# Patient Record
Sex: Male | Born: 1975
Health system: Southern US, Community
[De-identification: ages and names within clinical notes are randomized; demographics above are authoritative.]

## PROBLEM LIST (undated history)

## (undated) DIAGNOSIS — U071 COVID-19: Secondary | ICD-10-CM

## (undated) DIAGNOSIS — J301 Allergic rhinitis due to pollen: Secondary | ICD-10-CM

## (undated) DIAGNOSIS — J1282 Pneumonia due to coronavirus disease 2019: Secondary | ICD-10-CM

## (undated) HISTORY — PX: SHOULDER SURGERY: SHX246

## (undated) HISTORY — PX: FOOT SURGERY: SHX648

---

## 2005-04-07 ENCOUNTER — Emergency Department (HOSPITAL_COMMUNITY): Admission: EM | Admit: 2005-04-07 | Discharge: 2005-04-07 | Payer: Self-pay | Admitting: Emergency Medicine

## 2005-04-07 IMAGING — CR DG SHOULDER 2+V*R*
2 series · 2 of 2 positions shown · non-contrast
Comparison: none

CLINICAL DATA: Patient fell; question dislocation.  Unable to move arm.  
RIGHT SHOULDER - 3 VIEW:
Acromioclavicular dislocation.  istal clavicle is displaced superiorly, overriding the acromion.

[w shoulder ap internal right]
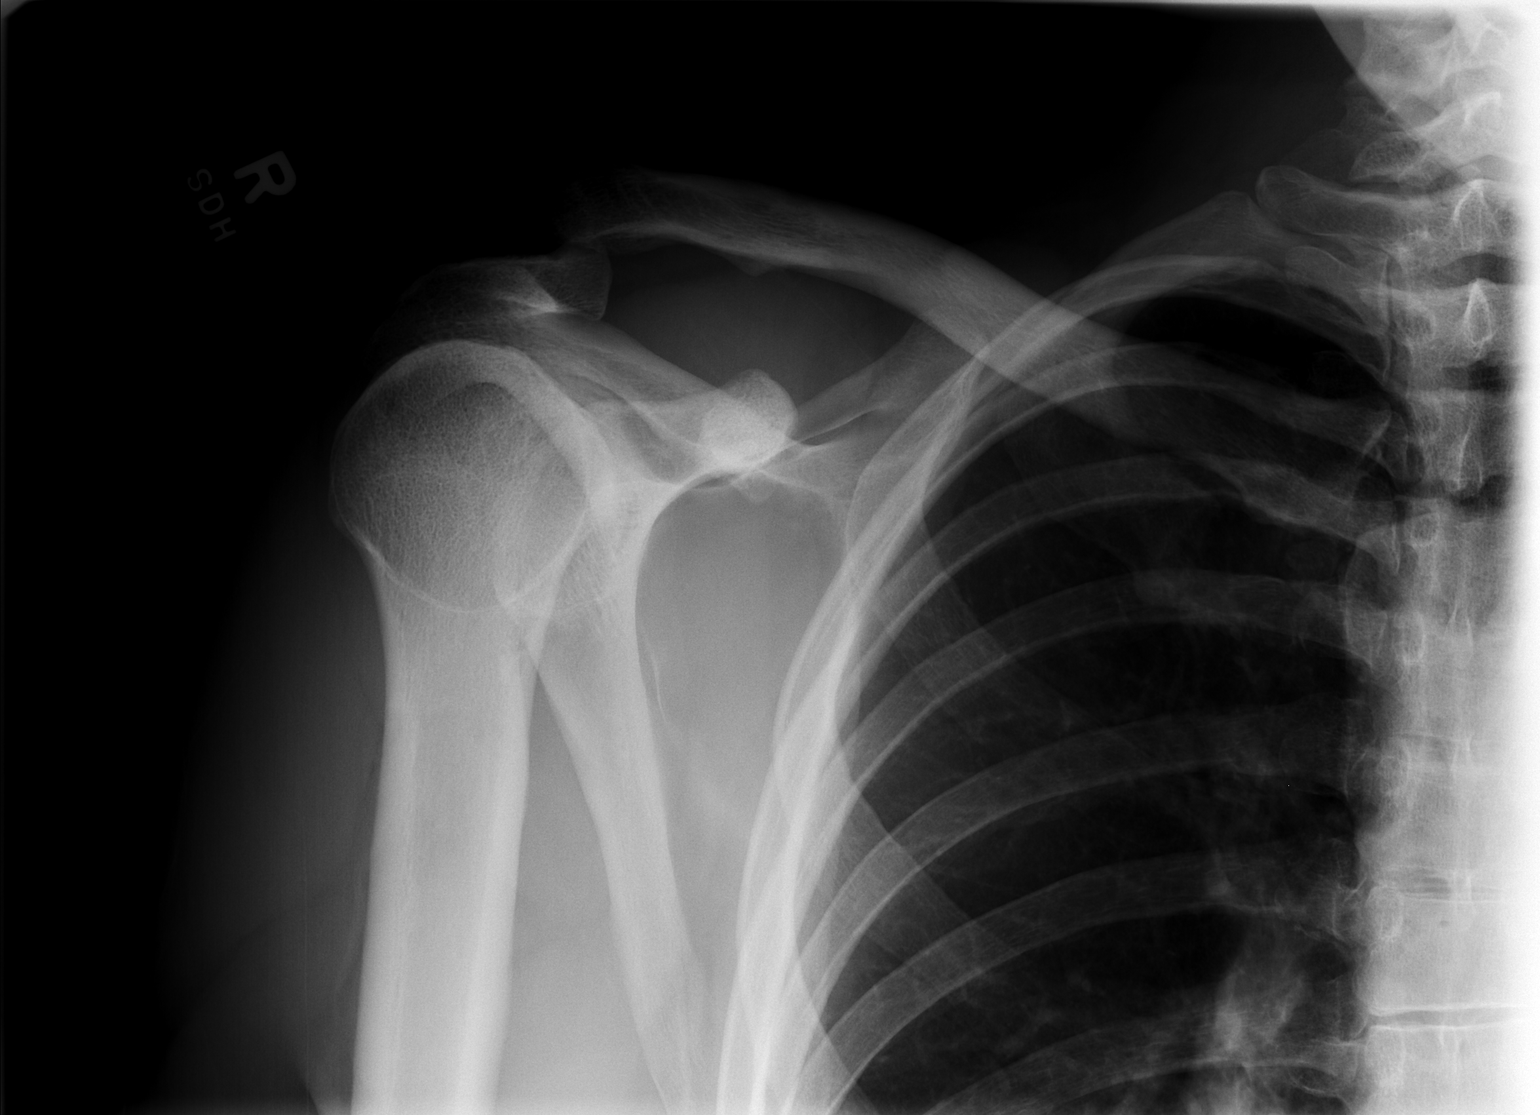

[w shoulder y view right]
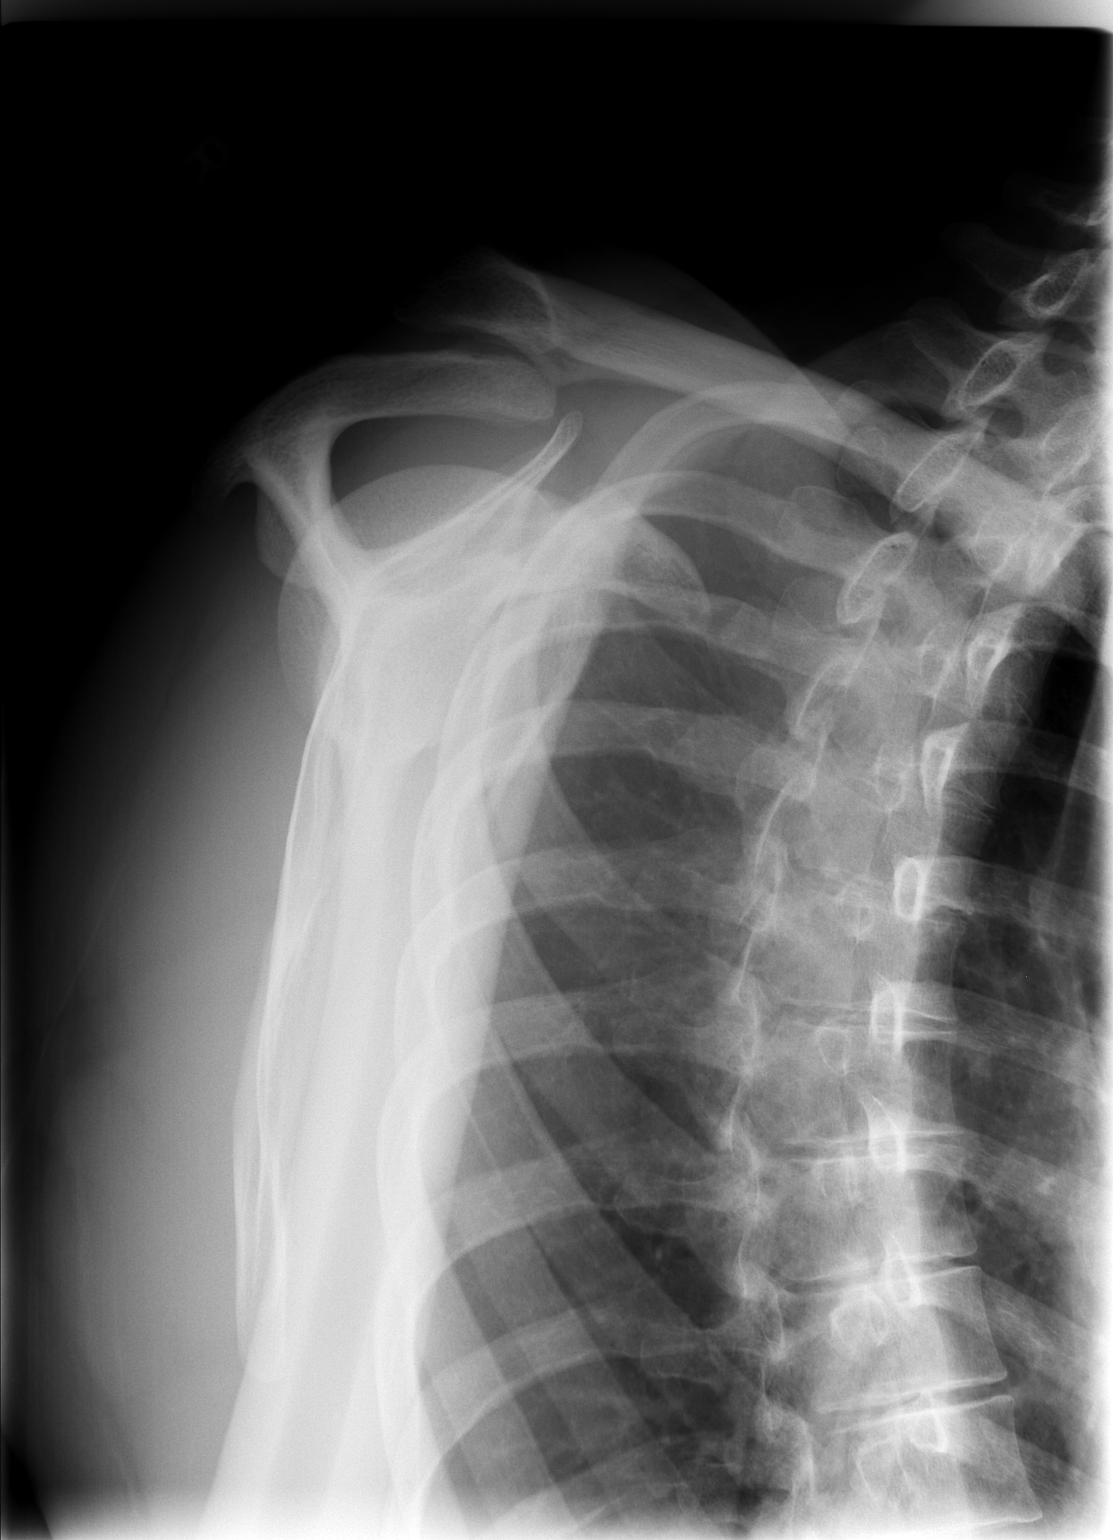

[2 of 2 positions shown; findings below may reference images not displayed]

IMPRESSION: Right AC dislocation.

## 2017-02-10 DIAGNOSIS — Z125 Encounter for screening for malignant neoplasm of prostate: Secondary | ICD-10-CM | POA: Diagnosis not present

## 2017-02-10 DIAGNOSIS — Z0000 Encounter for general adult medical examination without abnormal findings: Secondary | ICD-10-CM | POA: Diagnosis not present

## 2017-02-10 DIAGNOSIS — E559 Vitamin D deficiency, unspecified: Secondary | ICD-10-CM | POA: Diagnosis not present

## 2017-02-10 DIAGNOSIS — F419 Anxiety disorder, unspecified: Secondary | ICD-10-CM | POA: Diagnosis not present

## 2017-02-26 DIAGNOSIS — L218 Other seborrheic dermatitis: Secondary | ICD-10-CM | POA: Diagnosis not present

## 2017-02-26 DIAGNOSIS — L255 Unspecified contact dermatitis due to plants, except food: Secondary | ICD-10-CM | POA: Diagnosis not present

## 2017-05-08 DIAGNOSIS — L237 Allergic contact dermatitis due to plants, except food: Secondary | ICD-10-CM | POA: Diagnosis not present

## 2017-12-16 DIAGNOSIS — J0110 Acute frontal sinusitis, unspecified: Secondary | ICD-10-CM | POA: Diagnosis not present

## 2017-12-16 DIAGNOSIS — J209 Acute bronchitis, unspecified: Secondary | ICD-10-CM | POA: Diagnosis not present

## 2018-02-12 DIAGNOSIS — Z0000 Encounter for general adult medical examination without abnormal findings: Secondary | ICD-10-CM | POA: Diagnosis not present

## 2018-02-12 DIAGNOSIS — F419 Anxiety disorder, unspecified: Secondary | ICD-10-CM | POA: Diagnosis not present

## 2018-02-12 DIAGNOSIS — Z23 Encounter for immunization: Secondary | ICD-10-CM | POA: Diagnosis not present

## 2018-02-12 DIAGNOSIS — E559 Vitamin D deficiency, unspecified: Secondary | ICD-10-CM | POA: Diagnosis not present

## 2018-08-11 DIAGNOSIS — M25512 Pain in left shoulder: Secondary | ICD-10-CM | POA: Diagnosis not present

## 2018-08-11 DIAGNOSIS — N451 Epididymitis: Secondary | ICD-10-CM | POA: Diagnosis not present

## 2018-08-21 DIAGNOSIS — M25512 Pain in left shoulder: Secondary | ICD-10-CM | POA: Diagnosis not present

## 2018-09-18 DIAGNOSIS — M19012 Primary osteoarthritis, left shoulder: Secondary | ICD-10-CM | POA: Diagnosis not present

## 2018-09-18 DIAGNOSIS — M7542 Impingement syndrome of left shoulder: Secondary | ICD-10-CM | POA: Diagnosis not present

## 2018-09-18 DIAGNOSIS — M25512 Pain in left shoulder: Secondary | ICD-10-CM | POA: Diagnosis not present

## 2018-09-25 DIAGNOSIS — M25512 Pain in left shoulder: Secondary | ICD-10-CM | POA: Diagnosis not present

## 2018-09-28 DIAGNOSIS — M19012 Primary osteoarthritis, left shoulder: Secondary | ICD-10-CM | POA: Diagnosis not present

## 2018-10-01 DIAGNOSIS — M19012 Primary osteoarthritis, left shoulder: Secondary | ICD-10-CM | POA: Diagnosis not present

## 2018-10-01 DIAGNOSIS — M7542 Impingement syndrome of left shoulder: Secondary | ICD-10-CM | POA: Diagnosis not present

## 2018-10-01 DIAGNOSIS — G8918 Other acute postprocedural pain: Secondary | ICD-10-CM | POA: Diagnosis not present

## 2018-10-01 DIAGNOSIS — S46012A Strain of muscle(s) and tendon(s) of the rotator cuff of left shoulder, initial encounter: Secondary | ICD-10-CM | POA: Diagnosis not present

## 2018-10-01 DIAGNOSIS — M24112 Other articular cartilage disorders, left shoulder: Secondary | ICD-10-CM | POA: Diagnosis not present

## 2018-10-01 DIAGNOSIS — S43432A Superior glenoid labrum lesion of left shoulder, initial encounter: Secondary | ICD-10-CM | POA: Diagnosis not present

## 2018-10-14 DIAGNOSIS — S46012D Strain of muscle(s) and tendon(s) of the rotator cuff of left shoulder, subsequent encounter: Secondary | ICD-10-CM | POA: Diagnosis not present

## 2018-11-11 DIAGNOSIS — M7542 Impingement syndrome of left shoulder: Secondary | ICD-10-CM | POA: Diagnosis not present

## 2018-11-11 DIAGNOSIS — S46012D Strain of muscle(s) and tendon(s) of the rotator cuff of left shoulder, subsequent encounter: Secondary | ICD-10-CM | POA: Diagnosis not present

## 2018-11-17 DIAGNOSIS — M19012 Primary osteoarthritis, left shoulder: Secondary | ICD-10-CM | POA: Diagnosis not present

## 2018-11-17 DIAGNOSIS — M7542 Impingement syndrome of left shoulder: Secondary | ICD-10-CM | POA: Diagnosis not present

## 2018-11-17 DIAGNOSIS — M6281 Muscle weakness (generalized): Secondary | ICD-10-CM | POA: Diagnosis not present

## 2018-11-17 DIAGNOSIS — M25612 Stiffness of left shoulder, not elsewhere classified: Secondary | ICD-10-CM | POA: Diagnosis not present

## 2018-11-20 DIAGNOSIS — M25612 Stiffness of left shoulder, not elsewhere classified: Secondary | ICD-10-CM | POA: Diagnosis not present

## 2018-11-20 DIAGNOSIS — M7542 Impingement syndrome of left shoulder: Secondary | ICD-10-CM | POA: Diagnosis not present

## 2018-11-20 DIAGNOSIS — M25512 Pain in left shoulder: Secondary | ICD-10-CM | POA: Diagnosis not present

## 2018-11-20 DIAGNOSIS — M6281 Muscle weakness (generalized): Secondary | ICD-10-CM | POA: Diagnosis not present

## 2018-11-23 DIAGNOSIS — M25512 Pain in left shoulder: Secondary | ICD-10-CM | POA: Diagnosis not present

## 2018-11-23 DIAGNOSIS — M7542 Impingement syndrome of left shoulder: Secondary | ICD-10-CM | POA: Diagnosis not present

## 2018-11-23 DIAGNOSIS — M6281 Muscle weakness (generalized): Secondary | ICD-10-CM | POA: Diagnosis not present

## 2018-11-23 DIAGNOSIS — M25612 Stiffness of left shoulder, not elsewhere classified: Secondary | ICD-10-CM | POA: Diagnosis not present

## 2018-11-25 ENCOUNTER — Other Ambulatory Visit: Payer: Self-pay

## 2018-11-25 DIAGNOSIS — Z20822 Contact with and (suspected) exposure to covid-19: Secondary | ICD-10-CM

## 2018-11-27 DIAGNOSIS — B349 Viral infection, unspecified: Secondary | ICD-10-CM | POA: Diagnosis not present

## 2018-11-27 LAB — NOVEL CORONAVIRUS, NAA: SARS-CoV-2, NAA: NOT DETECTED

## 2018-11-28 DIAGNOSIS — B349 Viral infection, unspecified: Secondary | ICD-10-CM | POA: Diagnosis not present

## 2018-11-29 ENCOUNTER — Emergency Department (HOSPITAL_COMMUNITY): Payer: BC Managed Care – PPO

## 2018-11-29 ENCOUNTER — Emergency Department (HOSPITAL_COMMUNITY)
Admission: EM | Admit: 2018-11-29 | Discharge: 2018-11-29 | Disposition: A | Discharge status: Home / Self Care | Payer: BC Managed Care – PPO | Attending: Emergency Medicine | Admitting: Emergency Medicine

## 2018-11-29 ENCOUNTER — Other Ambulatory Visit: Payer: Self-pay

## 2018-11-29 ENCOUNTER — Encounter (HOSPITAL_COMMUNITY): Payer: Self-pay | Admitting: Emergency Medicine

## 2018-11-29 DIAGNOSIS — U071 COVID-19: Secondary | ICD-10-CM | POA: Diagnosis not present

## 2018-11-29 DIAGNOSIS — Z79899 Other long term (current) drug therapy: Secondary | ICD-10-CM | POA: Insufficient documentation

## 2018-11-29 DIAGNOSIS — R509 Fever, unspecified: Secondary | ICD-10-CM | POA: Diagnosis not present

## 2018-11-29 LAB — CBC WITH DIFFERENTIAL/PLATELET
Abs Immature Granulocytes: 0.03 10*3/uL (ref 0.00–0.07)
Basophils Absolute: 0 10*3/uL (ref 0.0–0.1)
Basophils Relative: 0 %
Eosinophils Absolute: 0 10*3/uL (ref 0.0–0.5)
Eosinophils Relative: 0 %
HCT: 41.6 % (ref 39.0–52.0)
Hemoglobin: 14.6 g/dL (ref 13.0–17.0)
Immature Granulocytes: 1 %
Lymphocytes Relative: 13 %
Lymphs Abs: 0.7 10*3/uL (ref 0.7–4.0)
MCH: 30.5 pg (ref 26.0–34.0)
MCHC: 35.1 g/dL (ref 30.0–36.0)
MCV: 86.8 fL (ref 80.0–100.0)
Monocytes Absolute: 0.4 10*3/uL (ref 0.1–1.0)
Monocytes Relative: 8 %
Neutro Abs: 4.1 10*3/uL (ref 1.7–7.7)
Neutrophils Relative %: 78 %
Platelets: 173 10*3/uL (ref 150–400)
RBC: 4.79 MIL/uL (ref 4.22–5.81)
RDW: 12.1 % (ref 11.5–15.5)
WBC: 5.3 10*3/uL (ref 4.0–10.5)
nRBC: 0 % (ref 0.0–0.2)

## 2018-11-29 LAB — URINALYSIS, ROUTINE W REFLEX MICROSCOPIC
Bilirubin Urine: NEGATIVE
Glucose, UA: NEGATIVE mg/dL
Hgb urine dipstick: NEGATIVE
Ketones, ur: NEGATIVE mg/dL
Leukocytes,Ua: NEGATIVE
Nitrite: NEGATIVE
Protein, ur: NEGATIVE mg/dL
Specific Gravity, Urine: 1.01 (ref 1.005–1.030)
pH: 5 (ref 5.0–8.0)

## 2018-11-29 LAB — COMPREHENSIVE METABOLIC PANEL
ALT: 36 U/L (ref 0–44)
AST: 42 U/L — ABNORMAL HIGH (ref 15–41)
Albumin: 4 g/dL (ref 3.5–5.0)
Alkaline Phosphatase: 88 U/L (ref 38–126)
Anion gap: 11 (ref 5–15)
BUN: 16 mg/dL (ref 6–20)
CO2: 23 mmol/L (ref 22–32)
Calcium: 8.5 mg/dL — ABNORMAL LOW (ref 8.9–10.3)
Chloride: 103 mmol/L (ref 98–111)
Creatinine, Ser: 1.44 mg/dL — ABNORMAL HIGH (ref 0.61–1.24)
GFR calc Af Amer: >60 mL/min (ref >60)
GFR calc non Af Amer: 60 mL/min — ABNORMAL LOW (ref >60)
Glucose, Bld: 103 mg/dL — ABNORMAL HIGH (ref 70–99)
Potassium: 3.2 mmol/L — ABNORMAL LOW (ref 3.5–5.1)
Sodium: 137 mmol/L (ref 135–145)
Total Bilirubin: 0.8 mg/dL (ref 0.3–1.2)
Total Protein: 6.8 g/dL (ref 6.5–8.1)

## 2018-11-29 LAB — INFLUENZA PANEL BY PCR (TYPE A & B)
Influenza A By PCR: NEGATIVE
Influenza B By PCR: NEGATIVE

## 2018-11-29 LAB — LACTIC ACID, PLASMA: Lactic Acid, Venous: 0.8 mmol/L (ref 0.5–1.9)

## 2018-11-29 LAB — POC SARS CORONAVIRUS 2 AG -  ED: SARS Coronavirus 2 Ag: POSITIVE — AB

## 2018-11-29 IMAGING — DX DG CHEST 1V PORT
1 series · 1 of 1 positions shown · non-contrast
Comparison: None.

CLINICAL DATA: Fever and chills.  Myalgia

EXAM:
PORTABLE CHEST 1 VIEW

[chest ap]
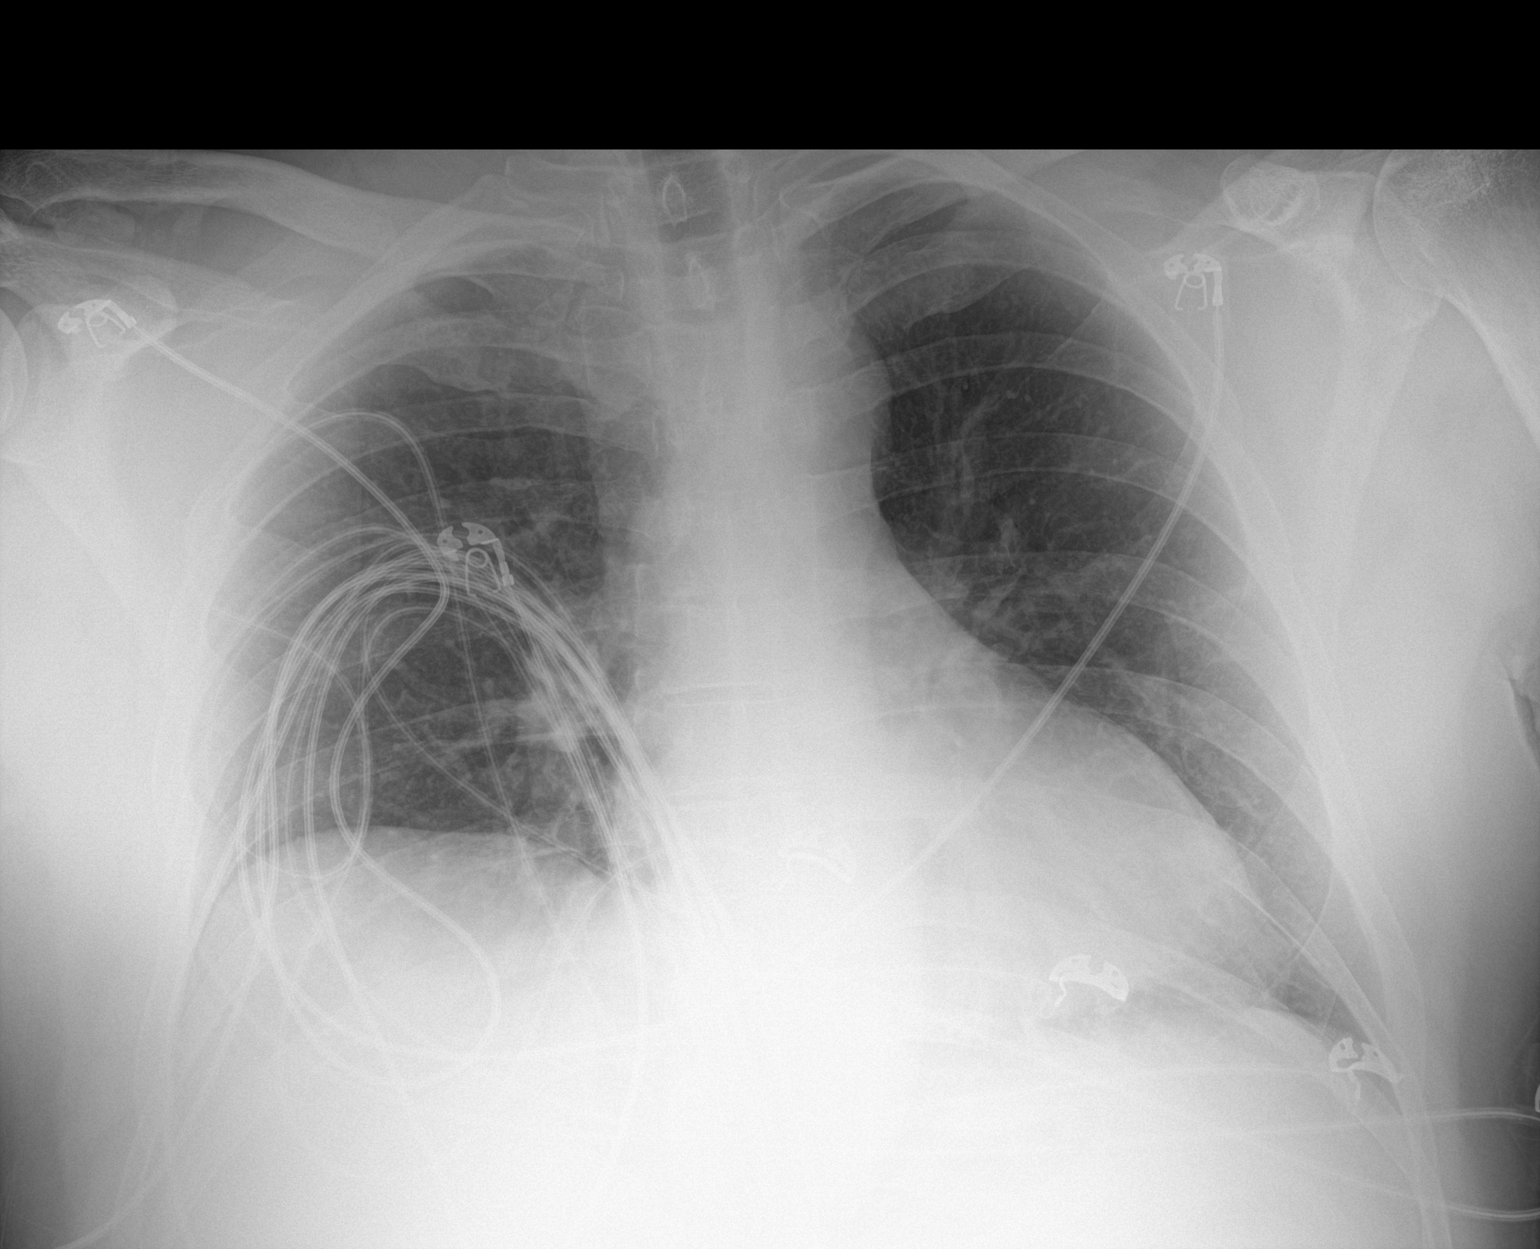

[1 of 1 positions shown; findings below may reference images not displayed]

FINDINGS: Artifact from EKG leads. Low volume chest. There is no edema,
consolidation, effusion, or pneumothorax. Normal heart size and
mediastinal contours.
IMPRESSION: Limited low volume chest without visible pneumonia.

## 2018-11-29 MED ORDER — POTASSIUM CHLORIDE CRYS ER 20 MEQ PO TBCR
40.0000 meq | EXTENDED_RELEASE_TABLET | Freq: Once | ORAL | Status: AC
  Administered 2018-11-29: 40 meq via ORAL
  Filled 2018-11-29: qty 2

## 2018-11-29 MED ORDER — SODIUM CHLORIDE 0.9 % IV SOLN
1000.0000 mL | INTRAVENOUS | Status: DC
  Administered 2018-11-29: 1000 mL via INTRAVENOUS

## 2018-11-29 MED ORDER — IBUPROFEN 200 MG PO TABS
400.0000 mg | ORAL_TABLET | Freq: Once | ORAL | Status: AC
  Administered 2018-11-29: 400 mg via ORAL
  Filled 2018-11-29: qty 2

## 2018-11-29 NOTE — ED Provider Notes (Signed)
[facility]-EMERGENCY DEPT Provider Note   CSN: [id number] Arrival date & time: 11/29/18  0358     History   Chief Complaint Chief Complaint  Patient presents with  . Fever  . Generalized Body Aches    HPI Carl Chang is a 43 y.o. male with a hx of no major medical problems presents to the Emergency Department complaining of gradual, persistent, progressively worsening Covid-like illness onset 5 days ago.  Patient reports 2 days before symptoms began he gathered with a group of friends and several of them have already tested positive for Covid 19.  Patient reports he has had headaches, abdominal cramping, nausea, diarrhea, cough, nasal congestion, loss of taste and smell and fevers.  Patient reports fevers to 102-103 at home.  He has been alternating Tylenol and ibuprofen without relief.  Additionally patient has also been taking NyQuil without relief.  Nothing seems to make his symptoms better.  Patient reports associated myalgias and general aching along with chills and rigors.  Patient denies neck pain or neck stiffness.  He does report that at times he feels lightheaded when walking.     The history is provided by the patient and medical records. No language interpreter was used.    History reviewed. No pertinent past medical history.  There are no active problems to display for this patient.      Home Medications    Prior to Admission medications   Medication Sig Start Date End Date Taking? Authorizing Provider  acetaminophen (TYLENOL) 325 MG tablet Take 650 mg by mouth every 6 (six) hours as needed for moderate pain.   Yes [provider]  DM-Doxylamine-Acetaminophen (NYQUIL HBP COLD & FLU) 15-6.25-325 MG/15ML LIQD Take 30 mLs by mouth at bedtime as needed (sleep/cough).   Yes [provider]  fexofenadine (ALLEGRA) 180 MG tablet Take 180 mg by mouth daily.   Yes [provider]  ibuprofen (ADVIL) 200 MG tablet Take 600  mg by mouth every 6 (six) hours as needed for moderate pain.   Yes [provider]  Multiple Vitamin (MULTIVITAMIN WITH MINERALS) TABS tablet Take 1 tablet by mouth daily.   Yes [provider]  sertraline (ZOLOFT) 100 MG tablet Take 150 mg by mouth daily. 11/23/18  Yes [provider]    Family History No family history on file.  Social History Social History   Tobacco Use  . Smoking status: Never Smoker  . Smokeless tobacco: Never Used  Substance Use Topics  . Alcohol use: Yes    Comment: seldom drinks. approx once a month  . Drug use: Not on file     Allergies   Prednisone   Review of Systems Review of Systems  Constitutional: Positive for fever. Negative for appetite change, diaphoresis, fatigue and unexpected weight change.  HENT: Positive for congestion. Negative for mouth sores.   Eyes: Positive for photophobia. Negative for visual disturbance.  Respiratory: Positive for cough and shortness of breath. Negative for chest tightness and wheezing.   Cardiovascular: Negative for chest pain.  Gastrointestinal: Positive for abdominal pain, diarrhea and nausea. Negative for constipation and vomiting.  Endocrine: Negative for polydipsia, polyphagia and polyuria.  Genitourinary: Negative for dysuria, frequency, hematuria and urgency.  Musculoskeletal: Positive for myalgias. Negative for back pain and neck stiffness.  Skin: Negative for rash.  Allergic/Immunologic: Negative for immunocompromised state.  Neurological: Positive for headaches. Negative for syncope and light-headedness.  Hematological: Does not bruise/bleed easily.  Psychiatric/Behavioral: Negative for sleep disturbance. The  patient is not nervous/anxious.      Physical Exam Updated Vital Signs BP (!) 142/94 (BP Location: Left Arm)   Pulse 95   Temp (!) 102.9 F (39.4 C) (Oral)   Resp 16   Ht 6\' 2"  (1.88 m)   Wt 111.1 kg   SpO2 97%   BMI 31.46 kg/m   Physical Exam Vitals  signs and nursing note reviewed.  Constitutional:      General: He is not in acute distress.    Appearance: He is not diaphoretic.  HENT:     Head: Normocephalic.  Eyes:     General: No scleral icterus.    Conjunctiva/sclera: Conjunctivae normal.  Neck:     Musculoskeletal: Normal range of motion.  Cardiovascular:     Rate and Rhythm: Regular rhythm. Tachycardia present.     Pulses: Normal pulses.          Radial pulses are 2+ on the right side and 2+ on the left side.  Pulmonary:     Effort: No tachypnea, accessory muscle usage, prolonged expiration, respiratory distress or retractions.     Breath sounds: No stridor.     Comments: Equal chest rise. No increased work of breathing. Abdominal:     General: There is no distension.     Palpations: Abdomen is soft.     Tenderness: There is no abdominal tenderness. There is no guarding or rebound.  Musculoskeletal:     Comments: Moves all extremities equally and without difficulty.  Skin:    General: Skin is warm and dry.     Capillary Refill: Capillary refill takes less than 2 seconds.  Neurological:     Mental Status: He is alert.     GCS: GCS eye subscore is 4. GCS verbal subscore is 5. GCS motor subscore is 6.     Comments: Speech is clear and goal oriented.  Psychiatric:        Mood and Affect: Mood normal.      ED Treatments / Results  Labs (all labs ordered are listed, but only abnormal results are displayed) Labs Reviewed  COMPREHENSIVE METABOLIC PANEL - Abnormal; Notable for the following components:      Result Value   Potassium 3.2 (*)    Glucose, Bld 103 (*)    Creatinine, Ser 1.44 (*)    Calcium 8.5 (*)    AST 42 (*)    GFR calc non Af Amer 60 (*)    All other components within normal limits  CULTURE, BLOOD (ROUTINE X 2)  CULTURE, BLOOD (ROUTINE X 2)  URINE CULTURE  LACTIC ACID, PLASMA  CBC WITH DIFFERENTIAL/PLATELET  INFLUENZA PANEL BY PCR (TYPE A & B)  URINALYSIS, ROUTINE W REFLEX MICROSCOPIC  POC  SARS CORONAVIRUS 2 AG -  ED     Radiology Dg Chest Port 1 View  Result Date: 11/29/2018 CLINICAL DATA:  Fever and chills.  Myalgia EXAM: PORTABLE CHEST 1 VIEW COMPARISON:  None. FINDINGS: Artifact from EKG leads. Low volume chest. There is no edema, consolidation, effusion, or pneumothorax. Normal heart size and mediastinal contours. IMPRESSION: Limited low volume chest without visible pneumonia. Electronically Signed   By: Monte Fantasia M.D.   On: 11/29/2018 06:02    Procedures Procedures (including critical care time)  Medications Ordered in ED Medications  0.9 %  sodium chloride infusion (1,000 mLs Intravenous New Bag/Given 11/29/18 0524)  potassium chloride SA (KLOR-CON) CR tablet 40 mEq (has no administration in time range)  ibuprofen (ADVIL) tablet 400  mg (400 mg Oral Given 11/29/18 0612)     Initial Impression / Assessment and Plan / ED Course  I have reviewed the triage vital signs and the nursing notes.  Pertinent labs & imaging results that were available during my care of the patient were reviewed by me and considered in my medical decision making (see chart for details).  Clinical Course as of Nov 28 636  Wynelle Link Nov 29, 2018  4235 No obvious pneumonia  DG Chest [place] 1 View [HM]  [phone] Leukocytosis  WBC: 5.3 [HM]  4315 Febrile  Temp(!): 102.9 F (39.4 C) [HM]  0630 Elevated.  Unknown baseline.  Fluids have been given.  Creatinine(!): 1.44 [HM]  0630 Patient tachypneic  Resp(!): 26 [HM]  0630 Mild hypokalemia, will replace.  Potassium(!): 3.2 [HM]  0632 WNL  Lactic Acid, Venous: 0.8 [HM]  0635 negative  Influenza A By PCR: NEGATIVE [HM]    Clinical Course User Index [HM] Madden Piazza, Philbert, Ocallaghan was evaluated in Emergency Department on 11/29/2018 for the symptoms described in the history of present illness. He was evaluated in the context of the global COVID-19 pandemic, which necessitated consideration that the patient might be at  risk for infection with the SARS-CoV-2 virus that causes COVID-19. Institutional protocols and algorithms that pertain to the evaluation of patients at risk for COVID-19 are in a state of rapid change based on information released by regulatory bodies including the CDC and federal and state organizations. These policies and algorithms were followed during the patient's care in the ED.  Patient with symptoms consistent with Covid.  Did have a negative test earlier in the week.  Will check for possible secondary infection.  Patient not hypoxic.  Tachycardia.  Suspect some level of dehydration given patient's decreased p.o. intake.  Fluids given.   6:38 AM Mild hypokalemia noted.  Potassium given.  Elevated serum creatinine.  I suspect some element of dehydration.  Fluids given.  Orthostatic VS for the past 24 hrs:  BP- Lying Pulse- Lying BP- Sitting Pulse- Sitting BP- Standing at 0 minutes Pulse- Standing at 0 minutes  11/29/18 0613 140/80 88 131/84 87 135/83 87    Patient is not orthostatic.  His initial mild tachycardia has resolved.  Does have some mild tachypnea.  He remains without hypoxia.  At shift change care was transferred to Blanchie Dessert, PA-C who will follow pending studies, re-evaulate and determine disposition.     Final Clinical Impressions(s) / ED Diagnoses   Final diagnoses:  Suspected COVID-19 virus infection    ED Discharge Orders    None       Milta Deiters 11/29/18 4008    Geoffery Lyons, MD 11/30/18 [phone]

## 2018-11-29 NOTE — ED Triage Notes (Signed)
Pt arriving POV with flu like symptoms. Pt reports being sick since last week. Pt has had a negative Covid test in the last week along with negative flu test. Pt presents with fever and generalized body aches. Nausea and diarrhea x3 days. Increased urination.

## 2018-11-29 NOTE — Discharge Instructions (Addendum)
Thank you for allowing [place] to care for you today.   Please return to the emergency department if you have any new or worsening symptoms.  You tested positive for covid-19 today.  Your flu test today is negative.  Medications- You can take medications to help treat your symptoms: -Tylenol for fever and body aches. Please take as prescribed on the bottle. -Over the coutner cough medicine such as mucinex, robitussin, or other brands.  Treatment- This is a virus and unfortunately there are no antibitotics approved to treat this virus at this time. It is important to monitor your symptoms closely: -You should have a theremometer at home to check your temperature when feeling feverish. -Use a pulse ox meter to measure your oxygen when feeling short of breath.  -If your fever is over 100.4 despite taking tylenol or if your oxygen level drops below 94% these are reasons to rturn to the emergency department for further evaluation. Please call the emergency department before you come to make [place] aware.    We recommend you self-isolate for 10 days and to inform your work/family/friends that you has the virus.  They will need to self-quarantine for 14 days to monitor for symptoms.    Again: symptoms of shortnessf breath, chest pain, difficulty breathing, new onset of confuison, any symptoms that are concerning. And you or the person should come to emergency department for evaluation.   I hope you feel better soon

## 2018-11-29 NOTE — ED Notes (Signed)
Pulse Ox while ambulating did not drop below 97% RA

## 2018-11-29 NOTE — ED Provider Notes (Signed)
Care assumed from H. Mutherbaugh PA-C at shift change pending covid test and UA.  See her note for full H&P.   Briefly this is a 43 year old male with no past medical history presenting with Covid-like symptoms x5 days.  He recently had a group gathering and since then several of the attendees have tested positive for Covid.  His symptoms include fever, headache, abdominal cramping, nausea, diarrhea, cough, congestion, loss of smell and taste.   Physical Exam  BP 131/84   Pulse 76   Temp (!) 102.9 F (39.4 C) (Oral)   Resp (!) 26   Ht '6\' 2"'  (1.88 m)   Wt 111.1 kg   SpO2 96%   BMI 31.46 kg/m   Physical Exam  Nursing notes and vitals reviewed PE: Constitutional: well-developed, well-nourished, no apparent distress HENT: normocephalic, atraumatic. no cervical adenopathy Cardiovascular: normal rate and rhythm, distal pulses intact Pulmonary/Chest: effort normal; breath sounds clear and equal bilaterally; no wheezes or rales Abdominal: soft and nontender Musculoskeletal: full ROM, no edema Neurological: alert with goal directed thinking Skin: warm and dry, no rash, no diaphoresis Psychiatric: normal mood and affect, normal behavior    ED Course/Procedures   Results for orders placed or performed during the hospital encounter of 11/29/18 (from the past 24 hour(s))  Lactic acid, plasma     Status: None   Collection Time: 11/29/18  5:26 AM  Result Value Ref Range   Lactic Acid, Venous 0.8 0.5 - 1.9 mmol/L  Comprehensive metabolic panel     Status: Abnormal   Collection Time: 11/29/18  5:26 AM  Result Value Ref Range   Sodium 137 135 - 145 mmol/L   Potassium 3.2 (L) 3.5 - 5.1 mmol/L   Chloride 103 98 - 111 mmol/L   CO2 23 22 - 32 mmol/L   Glucose, Bld 103 (H) 70 - 99 mg/dL   BUN 16 6 - 20 mg/dL   Creatinine, Ser 1.44 (H) 0.61 - 1.24 mg/dL   Calcium 8.5 (L) 8.9 - 10.3 mg/dL   Total Protein 6.8 6.5 - 8.1 g/dL   Albumin 4.0 3.5 - 5.0 g/dL   AST 42 (H) 15 - 41 U/L   ALT 36 0 -  44 U/L   Alkaline Phosphatase 88 38 - 126 U/L   Total Bilirubin 0.8 0.3 - 1.2 mg/dL   GFR calc non Af Amer 60 (L) >60 mL/min   GFR calc Af Amer >60 >60 mL/min   Anion gap 11 5 - 15  CBC WITH DIFFERENTIAL     Status: None   Collection Time: 11/29/18  5:26 AM  Result Value Ref Range   WBC 5.3 4.0 - 10.5 K/uL   RBC 4.79 4.22 - 5.81 MIL/uL   Hemoglobin 14.6 13.0 - 17.0 g/dL   HCT 41.6 39.0 - 52.0 %   MCV 86.8 80.0 - 100.0 fL   MCH 30.5 26.0 - 34.0 pg   MCHC 35.1 30.0 - 36.0 g/dL   RDW 12.1 11.5 - 15.5 %   Platelets 173 150 - 400 K/uL   nRBC 0.0 0.0 - 0.2 %   Neutrophils Relative % 78 %   Neutro Abs 4.1 1.7 - 7.7 K/uL   Lymphocytes Relative 13 %   Lymphs Abs 0.7 0.7 - 4.0 K/uL   Monocytes Relative 8 %   Monocytes Absolute 0.4 0.1 - 1.0 K/uL   Eosinophils Relative 0 %   Eosinophils Absolute 0.0 0.0 - 0.5 K/uL   Basophils Relative 0 %   Basophils  Absolute 0.0 0.0 - 0.1 K/uL   Immature Granulocytes 1 %   Abs Immature Granulocytes 0.03 0.00 - 0.07 K/uL  Urinalysis, Routine w reflex microscopic     Status: None   Collection Time: 11/29/18  5:26 AM  Result Value Ref Range   Color, Urine YELLOW YELLOW   APPearance CLEAR CLEAR   Specific Gravity, Urine 1.010 1.005 - 1.030   pH 5.0 5.0 - 8.0   Glucose, UA NEGATIVE NEGATIVE mg/dL   Hgb urine dipstick NEGATIVE NEGATIVE   Bilirubin Urine NEGATIVE NEGATIVE   Ketones, ur NEGATIVE NEGATIVE mg/dL   Protein, ur NEGATIVE NEGATIVE mg/dL   Nitrite NEGATIVE NEGATIVE   Leukocytes,Ua NEGATIVE NEGATIVE  Influenza panel by PCR (type A & B)     Status: None   Collection Time: 11/29/18  5:26 AM  Result Value Ref Range   Influenza A By PCR NEGATIVE NEGATIVE   Influenza B By PCR NEGATIVE NEGATIVE  POC SARS Coronavirus 2 Ag-ED - Nasal Swab (BD Veritor Kit)     Status: Abnormal   Collection Time: 11/29/18  7:34 AM  Result Value Ref Range   SARS Coronavirus 2 Ag POSITIVE (A) NEGATIVE      PORTABLE CHEST 1 VIEW    COMPARISON: None.     FINDINGS:  Artifact from EKG leads. Low volume chest. There is no edema,  consolidation, effusion, or pneumothorax. Normal heart size and  mediastinal contours.    IMPRESSION:  Limited low volume chest without visible pneumonia.      Electronically Signed  By: Monte Fantasia M.D.  On: 11/29/2018 06:02     MDM   6:45 AM Patient received in signout pending Covid test and UA. Work up per previous provider included sepsis orders. Chest xray without obvious pneumonia. Labs show normal lactic acid, mild hypokalemia repleted with PO potassium, no leukocytosis, slight bump in creatinine 1.44, no prior to compare. Pt given IVF fluids and ibuprofen. Orthostatics negative. Influenza test is negative. Chest xray without  Afebrile after ibuprofen.     7:44 AM Covid test is positive.  UA is without signs of infection.  Patient admits to feeling slightly better after IV fluids. On my exam tachypnea has resolved, still no hypoxia,  Patient is safe to discharge to home with symptomatic treatment.   Pt was given home self-isolation instructions and instructions for family members.  Pt understands signs and symptoms that would warrant return to ED.  Pt comfortable and agreeable with POC.    Carl Chang was evaluated in Emergency Department on 11/29/2018 for the symptoms described in the history of present illness. He was evaluated in the context of the global COVID-19 pandemic, which necessitated consideration that the patient might be at risk for infection with the SARS-CoV-2 virus that causes COVID-19. Institutional protocols and algorithms that pertain to the evaluation of patients at risk for COVID-19 are in a state of rapid change based on information released by regulatory bodies including the CDC and federal and state organizations. These policies and algorithms were followed during the patient's care in the ED.   Portions of this note were generated with [employer].  Dictation errors may occur despite best attempts at proofreading.     Cherre Robins, PA-C 11/29/18 0800    Veryl Speak, MD 11/30/18 [phone]

## 2018-11-30 LAB — URINE CULTURE: Culture: NO GROWTH

## 2018-12-04 ENCOUNTER — Other Ambulatory Visit: Payer: Self-pay

## 2018-12-04 ENCOUNTER — Inpatient Hospital Stay (HOSPITAL_COMMUNITY): Payer: BC Managed Care – PPO

## 2018-12-04 ENCOUNTER — Emergency Department (HOSPITAL_COMMUNITY): Payer: BC Managed Care – PPO

## 2018-12-04 ENCOUNTER — Encounter (HOSPITAL_COMMUNITY): Payer: Self-pay | Admitting: Internal Medicine

## 2018-12-04 ENCOUNTER — Inpatient Hospital Stay (HOSPITAL_COMMUNITY)
Admission: EM | Admit: 2018-12-04 | Discharge: 2018-12-08 | DRG: 177 | Disposition: A | Discharge status: Home / Self Care | Payer: BC Managed Care – PPO | Attending: Internal Medicine | Admitting: Internal Medicine

## 2018-12-04 DIAGNOSIS — Z888 Allergy status to other drugs, medicaments and biological substances status: Secondary | ICD-10-CM | POA: Diagnosis not present

## 2018-12-04 DIAGNOSIS — Z79899 Other long term (current) drug therapy: Secondary | ICD-10-CM | POA: Diagnosis not present

## 2018-12-04 DIAGNOSIS — J189 Pneumonia, unspecified organism: Secondary | ICD-10-CM | POA: Diagnosis present

## 2018-12-04 DIAGNOSIS — J301 Allergic rhinitis due to pollen: Secondary | ICD-10-CM | POA: Diagnosis present

## 2018-12-04 DIAGNOSIS — R197 Diarrhea, unspecified: Secondary | ICD-10-CM | POA: Diagnosis not present

## 2018-12-04 DIAGNOSIS — R0602 Shortness of breath: Secondary | ICD-10-CM | POA: Diagnosis not present

## 2018-12-04 DIAGNOSIS — K760 Fatty (change of) liver, not elsewhere classified: Secondary | ICD-10-CM | POA: Diagnosis not present

## 2018-12-04 DIAGNOSIS — J1289 Other viral pneumonia: Secondary | ICD-10-CM | POA: Diagnosis present

## 2018-12-04 DIAGNOSIS — E876 Hypokalemia: Secondary | ICD-10-CM | POA: Diagnosis not present

## 2018-12-04 DIAGNOSIS — J9601 Acute respiratory failure with hypoxia: Secondary | ICD-10-CM | POA: Diagnosis present

## 2018-12-04 DIAGNOSIS — R945 Abnormal results of liver function studies: Secondary | ICD-10-CM | POA: Diagnosis not present

## 2018-12-04 DIAGNOSIS — U071 COVID-19: Principal | ICD-10-CM | POA: Diagnosis present

## 2018-12-04 HISTORY — DX: Allergic rhinitis due to pollen: J30.1

## 2018-12-04 LAB — CBC WITH DIFFERENTIAL/PLATELET
Abs Immature Granulocytes: 0.16 10*3/uL — ABNORMAL HIGH (ref 0.00–0.07)
Basophils Absolute: 0 10*3/uL (ref 0.0–0.1)
Basophils Relative: 0 %
Eosinophils Absolute: 0 10*3/uL (ref 0.0–0.5)
Eosinophils Relative: 0 %
HCT: 43.3 % (ref 39.0–52.0)
Hemoglobin: 15.4 g/dL (ref 13.0–17.0)
Immature Granulocytes: 2 %
Lymphocytes Relative: 10 %
Lymphs Abs: 0.8 10*3/uL (ref 0.7–4.0)
MCH: 30.6 pg (ref 26.0–34.0)
MCHC: 35.6 g/dL (ref 30.0–36.0)
MCV: 85.9 fL (ref 80.0–100.0)
Monocytes Absolute: 0.4 10*3/uL (ref 0.1–1.0)
Monocytes Relative: 5 %
Neutro Abs: 6.5 10*3/uL (ref 1.7–7.7)
Neutrophils Relative %: 83 %
Platelets: 250 10*3/uL (ref 150–400)
RBC: 5.04 MIL/uL (ref 4.22–5.81)
RDW: 11.9 % (ref 11.5–15.5)
WBC: 7.8 10*3/uL (ref 4.0–10.5)
nRBC: 0 % (ref 0.0–0.2)

## 2018-12-04 LAB — COMPREHENSIVE METABOLIC PANEL
ALT: 70 U/L — ABNORMAL HIGH (ref 0–44)
AST: 83 U/L — ABNORMAL HIGH (ref 15–41)
Albumin: 3.7 g/dL (ref 3.5–5.0)
Alkaline Phosphatase: 103 U/L (ref 38–126)
Anion gap: 16 — ABNORMAL HIGH (ref 5–15)
BUN: 14 mg/dL (ref 6–20)
CO2: 24 mmol/L (ref 22–32)
Calcium: 8.6 mg/dL — ABNORMAL LOW (ref 8.9–10.3)
Chloride: 100 mmol/L (ref 98–111)
Creatinine, Ser: 1.19 mg/dL (ref 0.61–1.24)
GFR calc Af Amer: >60 mL/min (ref >60)
GFR calc non Af Amer: >60 mL/min (ref >60)
Glucose, Bld: 94 mg/dL (ref 70–99)
Potassium: 3.4 mmol/L — ABNORMAL LOW (ref 3.5–5.1)
Sodium: 140 mmol/L (ref 135–145)
Total Bilirubin: 1.2 mg/dL (ref 0.3–1.2)
Total Protein: 6.8 g/dL (ref 6.5–8.1)

## 2018-12-04 LAB — CULTURE, BLOOD (ROUTINE X 2)
Culture: NO GROWTH
Culture: NO GROWTH
Special Requests: ADEQUATE

## 2018-12-04 LAB — ABO/RH: ABO/RH(D): A POS

## 2018-12-04 LAB — HEPATITIS B SURFACE ANTIGEN: Hepatitis B Surface Ag: NONREACTIVE

## 2018-12-04 LAB — TROPONIN I (HIGH SENSITIVITY)
Troponin I (High Sensitivity): 5 ng/L (ref <18)
Troponin I (High Sensitivity): 4 ng/L (ref <18)

## 2018-12-04 LAB — FIBRINOGEN: Fibrinogen: 730 mg/dL — ABNORMAL HIGH (ref 210–475)

## 2018-12-04 LAB — C-REACTIVE PROTEIN: CRP: 5.6 mg/dL — ABNORMAL HIGH (ref <1.0)

## 2018-12-04 LAB — D-DIMER, QUANTITATIVE: D-Dimer, Quant: 0.95 ug/mL-FEU — ABNORMAL HIGH (ref 0.00–0.50)

## 2018-12-04 LAB — LACTATE DEHYDROGENASE: LDH: 429 U/L — ABNORMAL HIGH (ref 98–192)

## 2018-12-04 LAB — BRAIN NATRIURETIC PEPTIDE: B Natriuretic Peptide: 72.1 pg/mL (ref 0.0–100.0)

## 2018-12-04 IMAGING — DX DG CHEST 1V PORT
1 series · 1 of 1 positions shown · non-contrast
Comparison: [DATE]

CLINICAL DATA: COVID, shortness of breath, hypoxia

EXAM:
PORTABLE CHEST 1 VIEW

[chest ap]
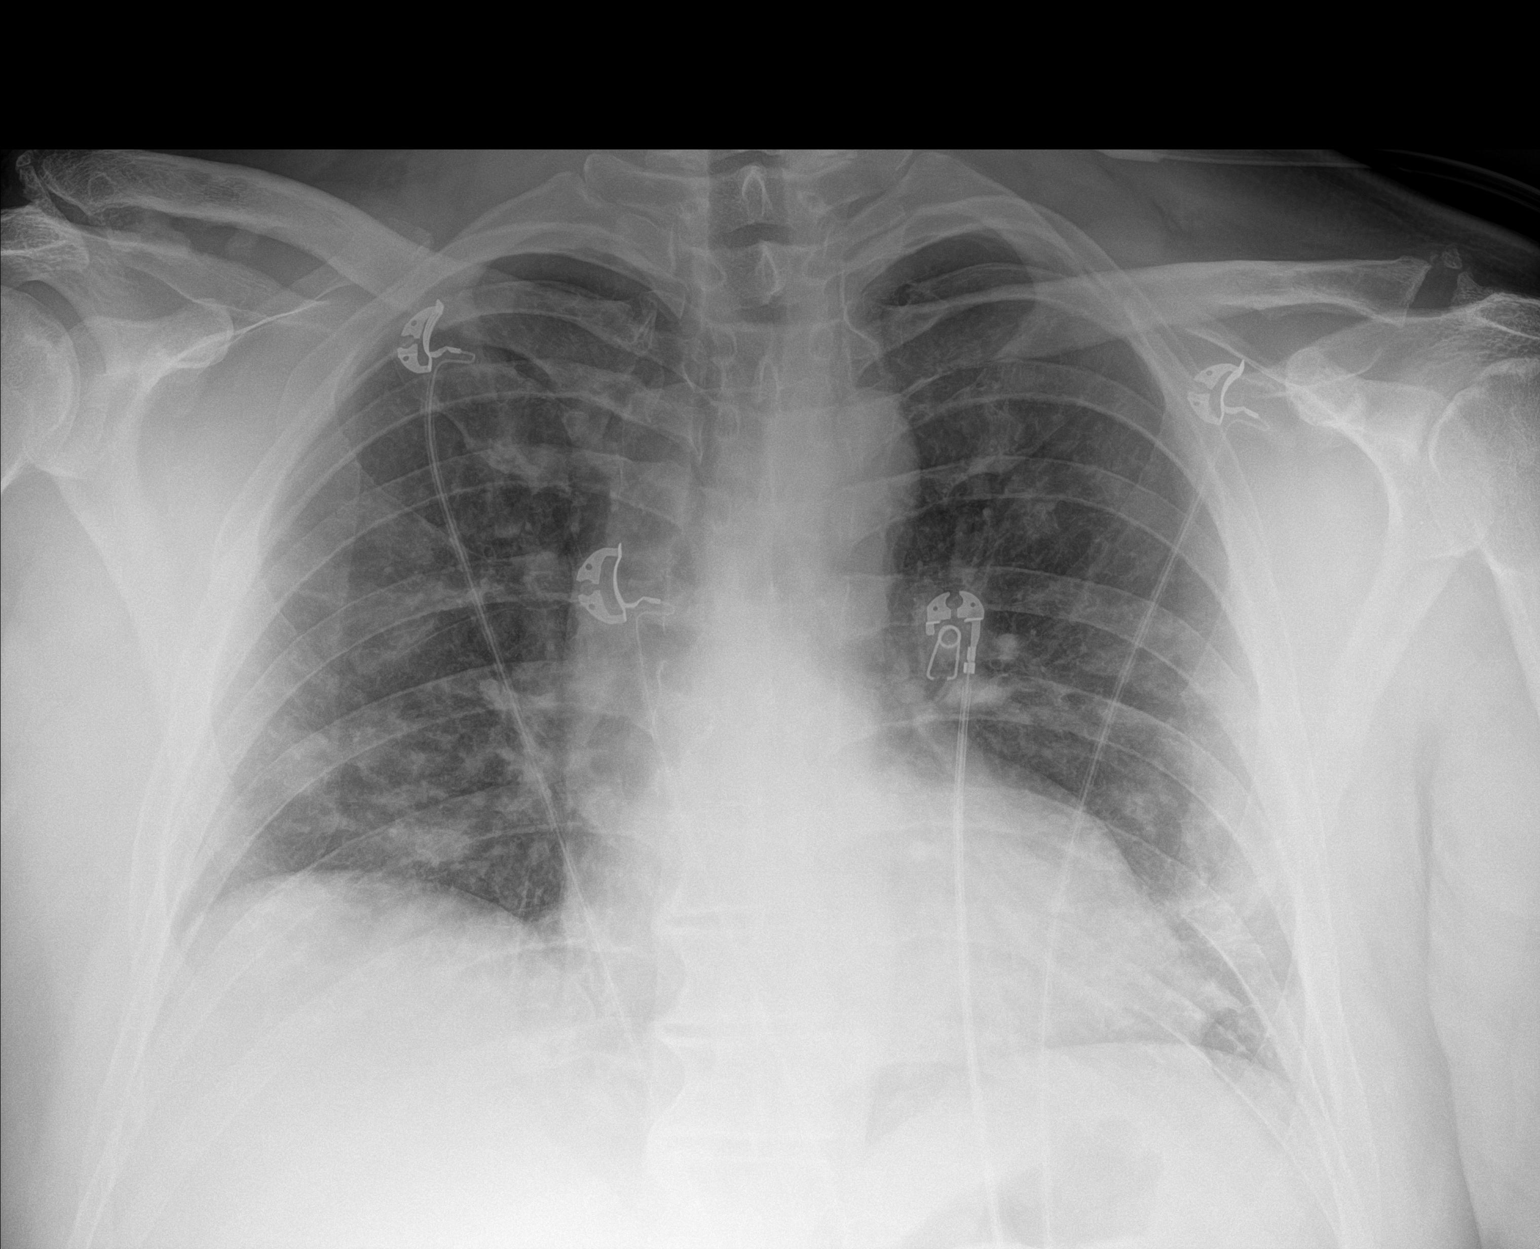

[1 of 1 positions shown; findings below may reference images not displayed]

FINDINGS: Worsening patchy airspace disease within both lungs, mostly in the
mid and lower lungs. Heart is normal size. No effusions. No acute
bony abnormality.
IMPRESSION: Worsening patchy bilateral airspace disease compatible with
pneumonia.

## 2018-12-04 IMAGING — US US ABDOMEN LIMITED
1 series · 14 of 25 positions shown · non-contrast
Comparison: None.

CLINICAL DATA: Right upper quadrant abdominal pain.

EXAM:
ULTRASOUND ABDOMEN LIMITED RIGHT UPPER QUADRANT

[Series 1: us abdomen limited · 14 of 33 slices shown]
[im 1/33]
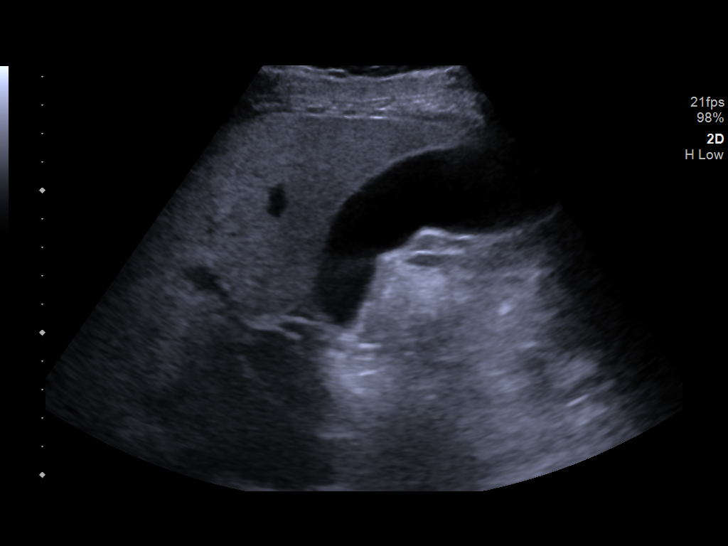
[im 3/33]
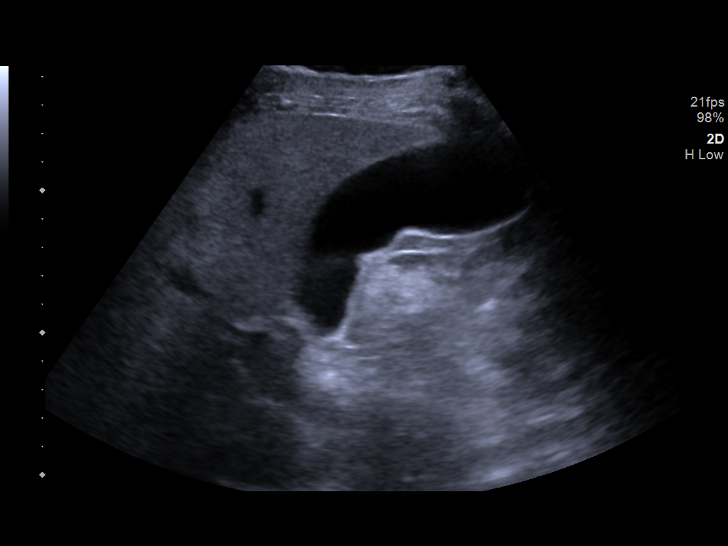
[im 6/33]
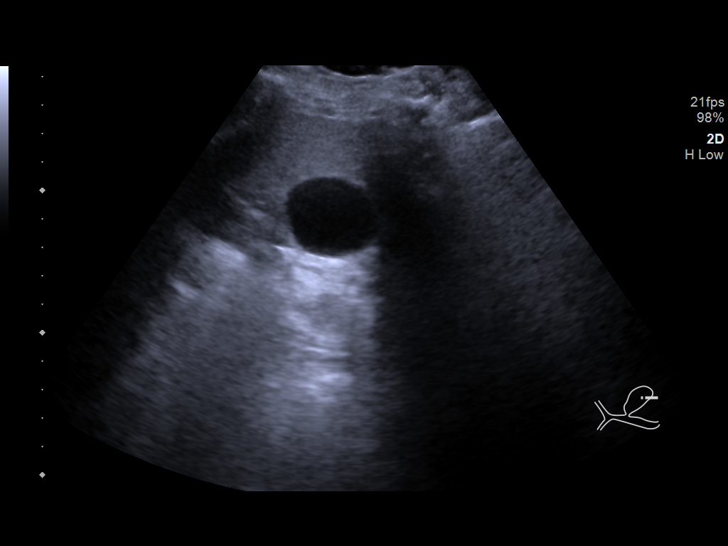
[im 9/33]
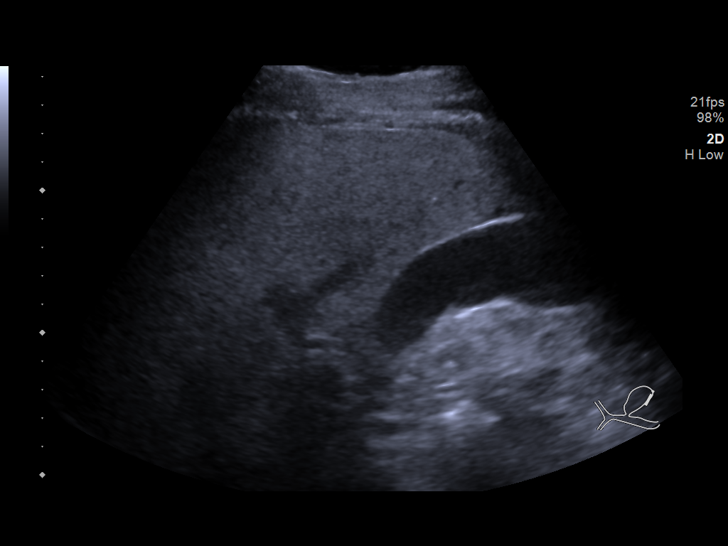
[im 11/33]
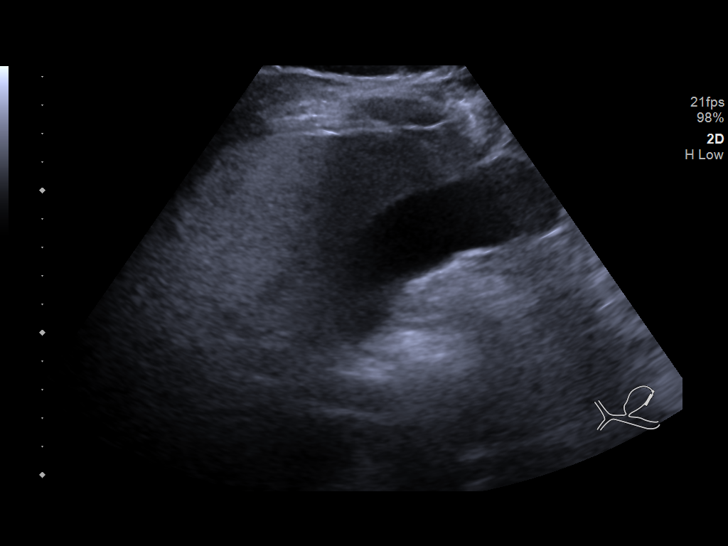
[im 13/33]
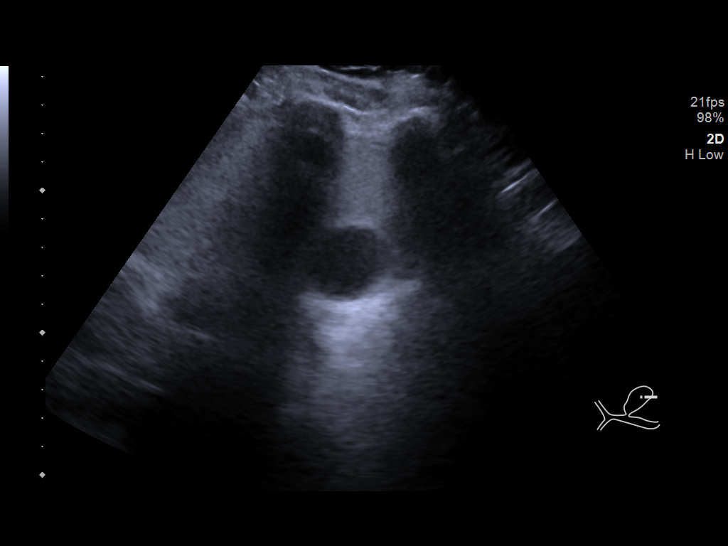
[im 15/33]
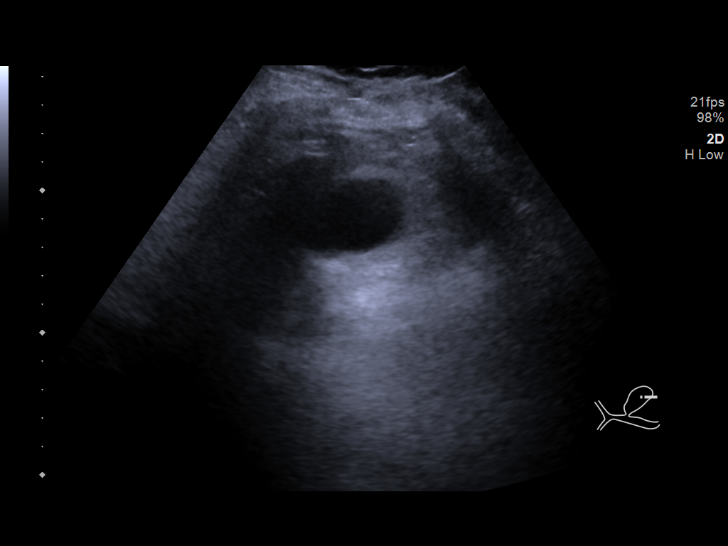
[im 18/33]
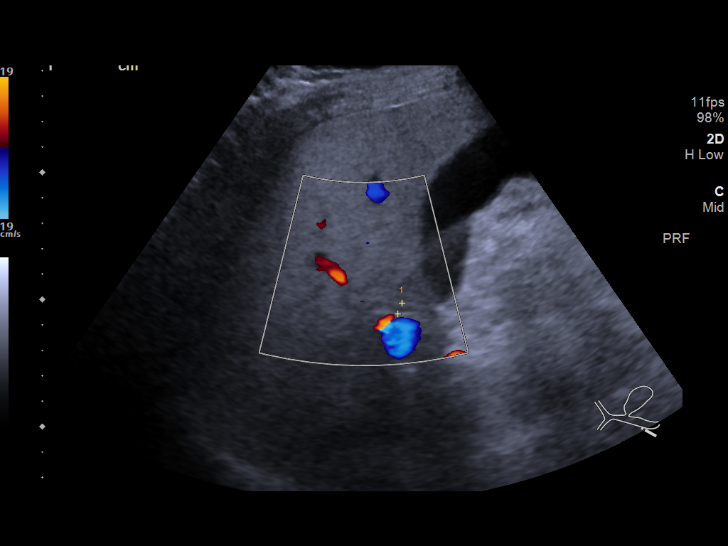
[im 21/33]
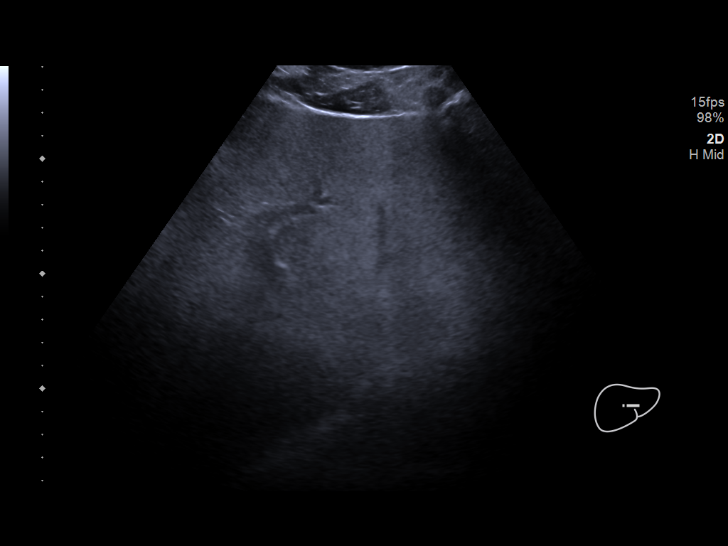
[im 22/33]
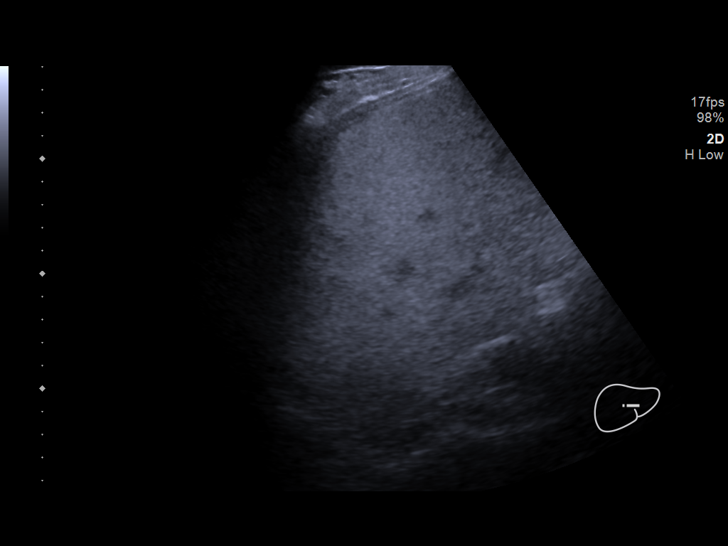
[im 25/33]
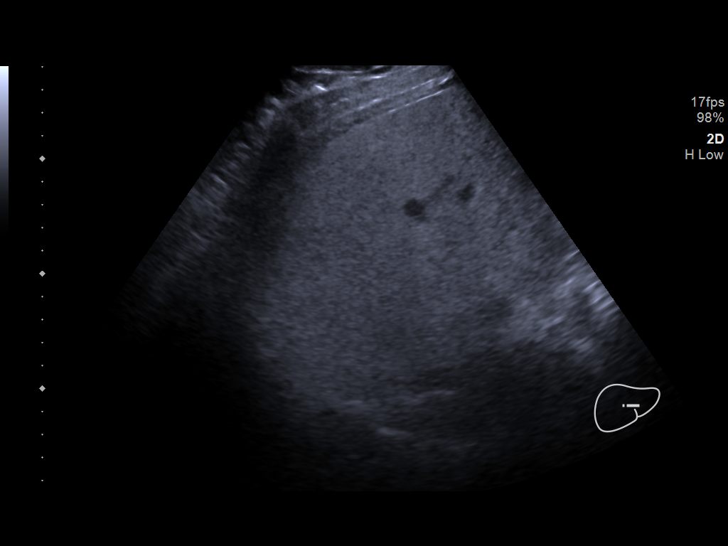
[im 27/33]
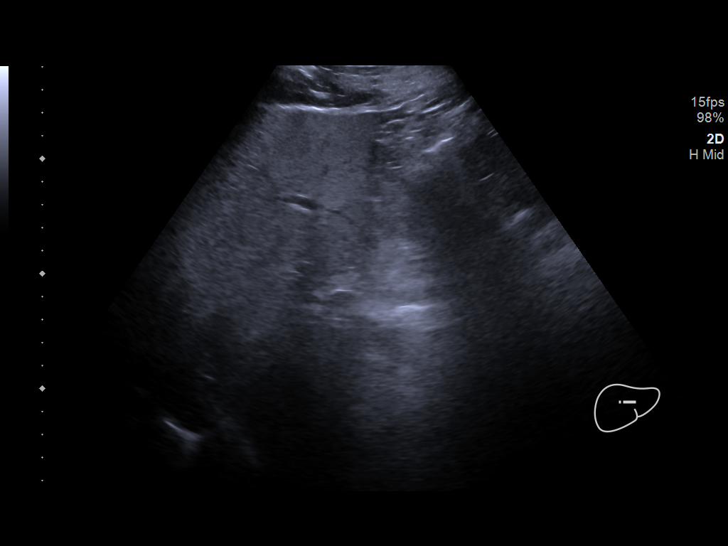
[im 30/33]
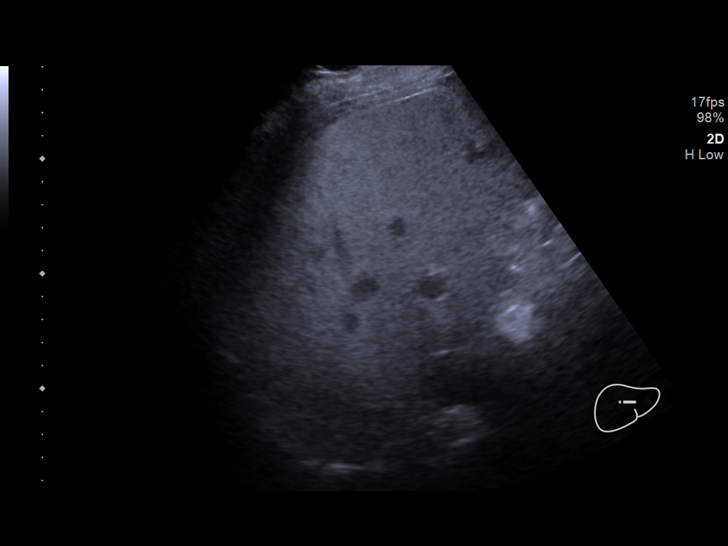
[im 33/33]
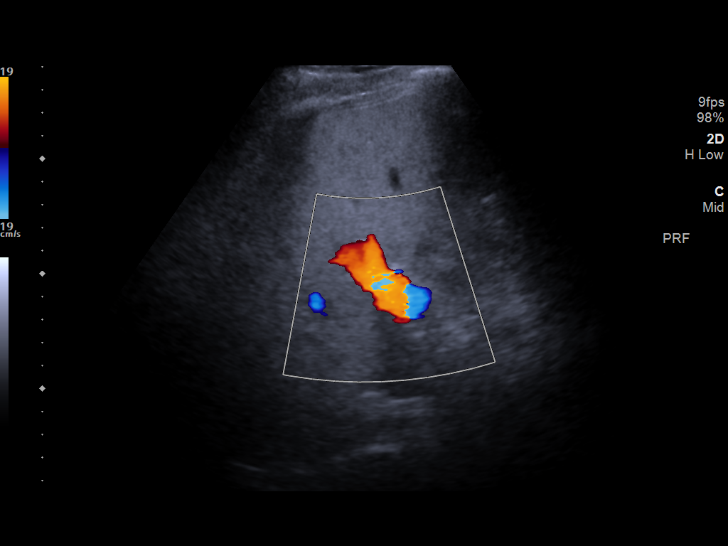

[14 of 25 positions shown; findings below may reference images not displayed]

FINDINGS: Gallbladder:

No gallstones or wall thickening visualized. No sonographic Murphy
sign noted by sonographer.

Common bile duct:

Diameter: 5 mm

Liver:

Diffuse increased echogenicity with slightly heterogeneous liver.
Appearance typically secondary to fatty infiltration. Fibrosis
secondary consideration. No secondary findings of cirrhosis noted.
No focal hepatic lesion or intrahepatic biliary duct dilatation.
Portal vein is patent on color Doppler imaging with normal direction
of blood flow towards the liver.

Other: None.
IMPRESSION: 1. No acute sonographic abnormality. No evidence for cholelithiasis
or acute cholecystitis.
2. Hepatic steatosis.

## 2018-12-04 MED ORDER — SODIUM CHLORIDE 0.9 % IV SOLN
INTRAVENOUS | Status: AC
  Administered 2018-12-04: 21:00:00 via INTRAVENOUS

## 2018-12-04 MED ORDER — DEXAMETHASONE SODIUM PHOSPHATE 10 MG/ML IJ SOLN
6.0000 mg | INTRAMUSCULAR | Status: DC
  Administered 2018-12-05 – 2018-12-07 (×3): 6 mg via INTRAVENOUS
  Filled 2018-12-04 (×3): qty 1

## 2018-12-04 MED ORDER — SODIUM CHLORIDE 0.9 % IV SOLN
500.0000 mg | INTRAVENOUS | Status: DC
  Administered 2018-12-04: 500 mg via INTRAVENOUS
  Filled 2018-12-04: qty 500

## 2018-12-04 MED ORDER — IBUPROFEN 400 MG PO TABS
600.0000 mg | ORAL_TABLET | Freq: Once | ORAL | Status: AC
  Administered 2018-12-04: 600 mg via ORAL
  Filled 2018-12-04: qty 1

## 2018-12-04 MED ORDER — ENOXAPARIN SODIUM 40 MG/0.4ML ~~LOC~~ SOLN
40.0000 mg | SUBCUTANEOUS | Status: DC
  Administered 2018-12-04: 40 mg via SUBCUTANEOUS
  Filled 2018-12-04: qty 0.4

## 2018-12-04 MED ORDER — ADULT MULTIVITAMIN W/MINERALS CH
1.0000 | ORAL_TABLET | Freq: Every day | ORAL | Status: DC
  Administered 2018-12-04 – 2018-12-08 (×5): 1 via ORAL
  Filled 2018-12-04 (×5): qty 1

## 2018-12-04 MED ORDER — DEXAMETHASONE SODIUM PHOSPHATE 10 MG/ML IJ SOLN
10.0000 mg | Freq: Once | INTRAMUSCULAR | Status: AC
  Administered 2018-12-04: 10 mg via INTRAVENOUS
  Filled 2018-12-04: qty 1

## 2018-12-04 MED ORDER — SODIUM CHLORIDE 0.9 % IV BOLUS
1000.0000 mL | Freq: Once | INTRAVENOUS | Status: AC
  Administered 2018-12-04: 1000 mL via INTRAVENOUS

## 2018-12-04 MED ORDER — SODIUM CHLORIDE 0.9 % IV SOLN
200.0000 mg | Freq: Once | INTRAVENOUS | Status: AC
  Administered 2018-12-04: 200 mg via INTRAVENOUS
  Filled 2018-12-04: qty 40

## 2018-12-04 MED ORDER — LOPERAMIDE HCL 2 MG PO CAPS
2.0000 mg | ORAL_CAPSULE | ORAL | Status: DC | PRN
  Filled 2018-12-04: qty 1

## 2018-12-04 MED ORDER — POTASSIUM CHLORIDE 10 MEQ/100ML IV SOLN
10.0000 meq | INTRAVENOUS | Status: AC
  Administered 2018-12-04 (×2): 10 meq via INTRAVENOUS
  Filled 2018-12-04 (×2): qty 100

## 2018-12-04 MED ORDER — LORATADINE 10 MG PO TABS
10.0000 mg | ORAL_TABLET | Freq: Every day | ORAL | Status: DC
  Administered 2018-12-04 – 2018-12-08 (×5): 10 mg via ORAL
  Filled 2018-12-04 (×5): qty 1

## 2018-12-04 MED ORDER — SERTRALINE HCL 50 MG PO TABS
150.0000 mg | ORAL_TABLET | Freq: Every day | ORAL | Status: DC
  Administered 2018-12-04 – 2018-12-08 (×5): 150 mg via ORAL
  Filled 2018-12-04 (×2): qty 3
  Filled 2018-12-04: qty 1
  Filled 2018-12-04 (×2): qty 3

## 2018-12-04 MED ORDER — SODIUM CHLORIDE 0.9 % IV SOLN
100.0000 mg | INTRAVENOUS | Status: DC
  Filled 2018-12-04 (×2): qty 20

## 2018-12-04 MED ORDER — SODIUM CHLORIDE 0.9 % IV SOLN
1.0000 g | INTRAVENOUS | Status: DC
  Administered 2018-12-04: 1 g via INTRAVENOUS
  Filled 2018-12-04: qty 10

## 2018-12-04 MED ORDER — ALBUTEROL SULFATE HFA 108 (90 BASE) MCG/ACT IN AERS
1.0000 | INHALATION_SPRAY | Freq: Once | RESPIRATORY_TRACT | Status: AC
  Administered 2018-12-04: 2 via RESPIRATORY_TRACT
  Filled 2018-12-04: qty 6.7

## 2018-12-04 NOTE — ED Provider Notes (Signed)
[facility] EMERGENCY [facility]MENT Provider Note   CSN: [id number] Arrival date & time: 12/04/18  1807     History   Chief Complaint Chief Complaint  Patient presents with  . Shortness of Breath    HPI Carl Chang is a 43 y.o. male is to emergency department today with chief complaint of shortness of breath.  Patient was seen in the ED on 11/29/2018 and diagnosed with Covid.  This is day 5 of his illness.  He states at home he has had worsening shortness of breath and cough.  He has a portable pulse ox at home and states his oxygen levels are in the mid to high 80s.  He has been taking Tylenol without symptom relief.  He reports fevers at home with T-max of 102 yesterday.  He has no appetite and decreased p.o. intake since symptom onset.  Patient is also reporting multiple episodes of loose stool.  He denies any blood in stool.  Also denies sore throat, congestion, chest pain, abdominal pain, nausea, vomiting, urinary symptoms, rash. History provided by patient with additional history obtained from chart review.      No past medical history on file.  There are no active problems to display for this patient.       Home Medications    Prior to Admission medications   Medication Sig Start Date End Date Taking? Authorizing Provider  acetaminophen (TYLENOL) 325 MG tablet Take 650 mg by mouth every 6 (six) hours as needed for moderate pain.    [provider]  DM-Doxylamine-Acetaminophen (NYQUIL HBP COLD & FLU) 15-6.25-325 MG/15ML LIQD Take 30 mLs by mouth at bedtime as needed (sleep/cough).    [provider]  fexofenadine (ALLEGRA) 180 MG tablet Take 180 mg by mouth daily.    [provider]  ibuprofen (ADVIL) 200 MG tablet Take 600 mg by mouth every 6 (six) hours as needed for moderate pain.    [provider]  Multiple Vitamin (MULTIVITAMIN WITH MINERALS) TABS tablet Take 1 tablet by mouth daily.    [provider]   sertraline (ZOLOFT) 100 MG tablet Take 150 mg by mouth daily. 11/23/18   [provider]    Family History No family history on file.  Social History Social History   Tobacco Use  . Smoking status: Never Smoker  . Smokeless tobacco: Never Used  Substance Use Topics  . Alcohol use: Yes    Comment: seldom drinks. approx once a month  . Drug use: Not on file     Allergies   Prednisone   Review of Systems Review of Systems All other systems are reviewed and are negative for acute change except as noted in the HPI.   Physical Exam Updated Vital Signs BP 129/84   Pulse 78   Temp 99.3 F (37.4 C) (Oral)   Resp 12   SpO2 95%   Physical Exam Vitals signs and nursing note reviewed.  Constitutional:      General: He is not in acute distress.    Appearance: He is ill-appearing. He is not toxic-appearing.  HENT:     Head: Normocephalic and atraumatic.     Right Ear: Tympanic membrane and external ear normal.     Left Ear: Tympanic membrane and external ear normal.     Nose: Nose normal.     Mouth/Throat:     Mouth: Mucous membranes are dry.     Pharynx: Oropharynx is clear.  Eyes:  General: No scleral icterus.       Right eye: No discharge.        Left eye: No discharge.     Extraocular Movements: Extraocular movements intact.     Conjunctiva/sclera: Conjunctivae normal.     Pupils: Pupils are equal, round, and reactive to light.  Neck:     Musculoskeletal: Normal range of motion.     Vascular: No JVD.  Cardiovascular:     Rate and Rhythm: Normal rate and regular rhythm.     Pulses: Normal pulses.          Radial pulses are 2+ on the right side and 2+ on the left side.     Heart sounds: Normal heart sounds.  Pulmonary:     Comments: Lung sounds diminished throughout.  Increased work of breathing.  Hypoxic to 90% on room air on arrival.  Patient placed on 3 L and SPO2 is 95%.. No wheezing, rales, or rhonchi. Abdominal:     Comments: Abdomen is soft,  non-distended, and non-tender in all quadrants. No rigidity, no guarding. No peritoneal signs.  Musculoskeletal: Normal range of motion.  Skin:    General: Skin is warm and dry.     Capillary Refill: Capillary refill takes less than 2 seconds.  Neurological:     Mental Status: He is oriented to person, place, and time.     GCS: GCS eye subscore is 4. GCS verbal subscore is 5. GCS motor subscore is 6.     Comments: Fluent speech, no facial droop.  Psychiatric:        Behavior: Behavior normal.      ED Treatments / Results  Labs (all labs ordered are listed, but only abnormal results are displayed) Labs Reviewed  COMPREHENSIVE METABOLIC PANEL - Abnormal; Notable for the following components:      Result Value   Potassium 3.4 (*)    Calcium 8.6 (*)    AST 83 (*)    ALT 70 (*)    Anion gap 16 (*)    All other components within normal limits  CBC WITH DIFFERENTIAL/PLATELET - Abnormal; Notable for the following components:   Abs Immature Granulocytes 0.16 (*)    All other components within normal limits    EKG EKG Interpretation  Date/Time:  Friday December 04 2018 18:21:23 EST Ventricular Rate:  80 PR Interval:    QRS Duration: 88 QT Interval:  385 QTC Calculation: 445 R Axis:   17 Text Interpretation: Sinus rhythm Baseline wander in lead(s) V2 No previous tracing Confirmed by Gwyneth Sprout ([id number]) on 12/04/2018 6:27:52 PM   Radiology Dg Chest Portable 1 View  Result Date: 12/04/2018 CLINICAL DATA:  COVID, shortness of breath, hypoxia EXAM: PORTABLE CHEST 1 VIEW COMPARISON:  11/29/2018 FINDINGS: Worsening patchy airspace disease within both lungs, mostly in the mid and lower lungs. Heart is normal size. No effusions. No acute bony abnormality. IMPRESSION: Worsening patchy bilateral airspace disease compatible with pneumonia. Electronically Signed   By: Charlett Nose M.D.   On: 12/04/2018 19:21    Procedures .Critical Care Performed by: Sherene Sires, PA-C  Authorized by: Sherene Sires, PA-C   Critical care provider statement:    Critical care time (minutes):  37   Critical care time was exclusive of:  Separately billable procedures and treating other patients and teaching time   Critical care was necessary to treat or prevent imminent or life-threatening deterioration of the following conditions:  Respiratory failure   Critical care was time  spent personally by me on the following activities:  Blood draw for specimens, discussions with consultants, evaluation of patient's response to treatment, examination of patient, obtaining history from patient or surrogate, ordering and performing treatments and interventions, ordering and review of laboratory studies, pulse oximetry, re-evaluation of patient's condition, ordering and review of radiographic studies and review of old charts   I assumed direction of critical care for this patient from another provider in my specialty: no     (including critical care time)  Medications Ordered in ED Medications  sodium chloride 0.9 % bolus 1,000 mL (1,000 mLs Intravenous New Bag/Given 12/04/18 1925)  dexamethasone (DECADRON) injection 10 mg (10 mg Intravenous Given 12/04/18 1924)  albuterol (VENTOLIN HFA) 108 (90 Base) MCG/ACT inhaler 1-2 puff (2 puffs Inhalation Given 12/04/18 1925)  ibuprofen (ADVIL) tablet 600 mg (600 mg Oral Given 12/04/18 1921)     Initial Impression / Assessment and Plan / ED Course  I have reviewed the triage vital signs and the nursing notes.  Pertinent labs & imaging results that were available during my care of the patient were reviewed by me and considered in my medical decision making (see chart for details).   Patient seen and examined. He has low-grade temp of 99.3.   On arrival he was hypoxic to 90% on room air.  He was placed on 3 L with only improvement to 95%.  Lungs are clear to auscultation all fields.  He has increased work of breathing with accessory muscle use.  Lung sounds diminished throughout, no wheeze,  Rales or rhonchi noted.  EKG without ischemic changes. Patient hypoxic, on 3 L nasal cannula with improvement to 95%.  Chest x-ray viewed by me shows pneumonia versus Covid.  Labs today show no leukocytosis, no anemia.  CMP with mild hypokalemia, elevation of liver enzymes and anion gap of 16.  IVF started.  Patient given ibuprofen, Decadron and albuterol inhaler.  On reassessment he report symptoms of not improved.  As patient has new oxygen requirement will consult hospitalist for admission.  Spoke with Dr. Maudie Mercury with hospitalist service who agrees to assume care of patient and bring into the hospital for further evaluation and management.     Carl Chang was evaluated in Emergency Department on 12/04/2018 for the symptoms described in the history of present illness. He was evaluated in the context of the global COVID-19 pandemic, which necessitated consideration that the patient might be at risk for infection with the SARS-CoV-2 virus that causes COVID-19. Institutional protocols and algorithms that pertain to the evaluation of patients at risk for COVID-19 are in a state of rapid change based on information released by regulatory bodies including the CDC and federal and state organizations. These policies and algorithms were followed during the patient's care in the ED.   Final Clinical Impressions(s) / ED Diagnoses   Final diagnoses:  None    ED Discharge Orders    None       Cherre Robins, PA-C 12/04/18 2030    Lucrezia Starch, MD 12/05/18 1133

## 2018-12-04 NOTE — ED Triage Notes (Addendum)
Pt endorses increased work of breathing and reports monitoring his oxygen levels at home since being diagnosed with Covid, noticed today his levels were in the mid to upper 80's.

## 2018-12-04 NOTE — ED Notes (Signed)
Pt placed on 2L O2 [place]. O2 saturation is now at 94-95%

## 2018-12-04 NOTE — H&P (Addendum)
TRH H&P    Patient Demographics:    Carl Chang, is a 43 y.o. male  MRN: [id number]  DOB - [birth year]  Admit Date - 12/04/2018  Referring MD/NP/PA: Binnie Rail  Outpatient Primary MD for the patient is Patient, No Pcp Per  Patient coming from:  home  Chief complaint- dyspnea   HPI:    Carl Chang  is a 43 y.o. male, covid-19 positive  w c/o fever for the past 11 days, along with dyspnea.  Pt has had diarrhea as well.  + cough w yellow sputum for the past few days.   Pt tested + in ED for covid on 11/29/2018.   Pt presented to ED tonight because pox <90% at home.    Pt thinks that got it at an axe throwing event.  Pt denies cp, palp, n/v, abd pain, brbpr, dysuria, hematuria.     In ED,  T 99.3, P 78 R 20, Bp 127/83  Pox 90% on RA,  96% on 3L [place]  Na 140, K 3.4, Bun 14, Creatinine 1.19 Ast 83, Alt 70   WBc 7.8, Hgb 15.4, Plt 250  CXR IMPRESSION: Worsening patchy bilateral airspace disease compatible with pneumonia.  Ekg nsr at 80, nl axis, nl int, no st-t changes c/w ischemia  Pt will be admitted for acute respiratory failure with hypoxia secondary to covid -19 as well as abnormal liver function and hypokalemia.       Review of systems:    In addition to the HPI above,    No Headache, No changes with Vision or hearing, No problems swallowing food or Liquids, No Chest pain,   No Abdominal pain, No Nausea or Vomiting,   No Blood in stool or Urine, No dysuria, No new skin rashes or bruises, No new joints pains-aches,  No new weakness, tingling, numbness in any extremity, No recent weight gain or loss, No polyuria, polydypsia or polyphagia, No significant Mental Stressors.  All other systems reviewed and are negative.    Past History of the following :    Past Medical History:  Diagnosis Date  . Hayfever      Hayfever No hx of athma or Copd R shoulder surgery  Past  Surgical History:  Procedure Laterality Date  . SHOULDER SURGERY        Social History:      Social History   Tobacco Use  . Smoking status: Never Smoker  . Smokeless tobacco: Never Used  Substance Use Topics  . Alcohol use: Yes    Comment: seldom drinks. approx once a month       Family History :    History reviewed. No pertinent family history. None per patient.    Home Medications:   Prior to Admission medications   Medication Sig Start Date End Date Taking? Authorizing Provider  acetaminophen (TYLENOL) 325 MG tablet Take 650 mg by mouth every 6 (six) hours as needed for moderate pain.   Yes [provider]  DM-Doxylamine-Acetaminophen (NYQUIL HBP COLD & FLU) 15-6.25-325 MG/15ML LIQD Take 30 mLs  by mouth at bedtime as needed (sleep/cough).   Yes [provider]  fexofenadine (ALLEGRA) 180 MG tablet Take 180 mg by mouth daily.   Yes [provider]  ibuprofen (ADVIL) 200 MG tablet Take 600 mg by mouth every 6 (six) hours as needed for moderate pain.   Yes [provider]  loperamide (IMODIUM) 2 MG capsule Take 2 mg by mouth as needed for diarrhea or loose stools.   Yes [provider]  Multiple Vitamin (MULTIVITAMIN WITH MINERALS) TABS tablet Take 1 tablet by mouth daily.   Yes [provider]  sertraline (ZOLOFT) 100 MG tablet Take 150 mg by mouth daily. 11/23/18  Yes [provider]     Allergies:     Allergies  Allergen Reactions  . Prednisone Other (See Comments)    hiccups      Physical Exam:   Vitals  Blood pressure 132/76, pulse 75, temperature 99.3 F (37.4 C), temperature source Oral, resp. rate (!) 24, SpO2 95 %.  1.  General: axoxo3  2. Psychiatric: euthymic  3. Neurologic: nonfocal  4. HEENMT:  Anicteric, pupils 1.45mm symmetric, direct,consensual, near intact Neck: no jvd  5. Respiratory : Crackles right lung base, no wheezing  6. Cardiovascular : rrr s1, s2, no m/g/r   7. Gastrointestinal:  Abd: soft, obese, nt, nd, +bs  8. Skin:  Ext: no c/c/e, no rash  9.Musculoskeletal:  Good ROM    Data Review:    CBC Recent Labs  Lab 11/29/18 0526 12/04/18 1840  WBC 5.3 7.8  HGB 14.6 15.4  HCT 41.6 43.3  PLT 173 250  MCV 86.8 85.9  MCH 30.5 30.6  MCHC 35.1 35.6  RDW 12.1 11.9  LYMPHSABS 0.7 0.8  MONOABS 0.4 0.4  EOSABS 0.0 0.0  BASOSABS 0.0 0.0   ------------------------------------------------------------------------------------------------------------------  Results for orders placed or performed during the hospital encounter of 12/04/18 (from the past 48 hour(s))  Comprehensive metabolic panel     Status: Abnormal   Collection Time: 12/04/18  6:40 PM  Result Value Ref Range   Sodium 140 135 - 145 mmol/L   Potassium 3.4 (L) 3.5 - 5.1 mmol/L   Chloride 100 98 - 111 mmol/L   CO2 24 22 - 32 mmol/L   Glucose, Bld 94 70 - 99 mg/dL   BUN 14 6 - 20 mg/dL   Creatinine, Ser 0.981.19 0.61 - 1.24 mg/dL   Calcium 8.6 (L) 8.9 - 10.3 mg/dL   Total Protein 6.8 6.5 - 8.1 g/dL   Albumin 3.7 3.5 - 5.0 g/dL   AST 83 (H) 15 - 41 U/L   ALT 70 (H) 0 - 44 U/L   Alkaline Phosphatase 103 38 - 126 U/L   Total Bilirubin 1.2 0.3 - 1.2 mg/dL   GFR calc non Af Amer >60 >60 mL/min   GFR calc Af Amer >60 >60 mL/min   Anion gap 16 (H) 5 - 15    Comment: Performed at [facility], [address] [place], [place] [address]  CBC with Differential     Status: Abnormal   Collection Time: 12/04/18  6:40 PM  Result Value Ref Range   WBC 7.8 4.0 - 10.5 K/uL   RBC 5.04 4.22 - 5.81 MIL/uL   Hemoglobin 15.4 13.0 - 17.0 g/dL   HCT 78.243.3 95.639.0 - 21.352.0 %   MCV 85.9 80.0 - 100.0 fL   MCH 30.6 26.0 - 34.0 pg   MCHC 35.6 30.0 - 36.0 g/dL  RDW 11.9 11.5 - 15.5 %   Platelets 250 150 - 400 K/uL   nRBC 0.0 0.0 - 0.2 %   Neutrophils Relative % 83 %   Neutro Abs 6.5 1.7 - 7.7 K/uL   Lymphocytes Relative 10 %   Lymphs Abs 0.8 0.7 - 4.0 K/uL   Monocytes Relative 5 %    Monocytes Absolute 0.4 0.1 - 1.0 K/uL   Eosinophils Relative 0 %   Eosinophils Absolute 0.0 0.0 - 0.5 K/uL   Basophils Relative 0 %   Basophils Absolute 0.0 0.0 - 0.1 K/uL   Immature Granulocytes 2 %   Abs Immature Granulocytes 0.16 (H) 0.00 - 0.07 K/uL    Comment: Performed at [facility], [address], [place], [place] [address]  ABO/Rh     Status: None   Collection Time: 12/04/18  6:40 PM  Result Value Ref Range   ABO/RH(D)      A POS Performed at [facility], [address] [place], [place] [address]     Chemistries  Recent Labs  Lab 11/29/18 [phone] 12/04/18 1840  NA 137 140  K 3.2* 3.4*  CL 103 100  CO2 23 24  GLUCOSE 103* 94  BUN 16 14  CREATININE 1.44* 1.19  CALCIUM 8.5* 8.6*  AST 42* 83*  ALT 36 70*  ALKPHOS 88 103  BILITOT 0.8 1.2   ------------------------------------------------------------------------------------------------------------------  ------------------------------------------------------------------------------------------------------------------ GFR: Estimated Creatinine Clearance: 108.4 mL/min (by C-G formula based on SCr of 1.19 mg/dL). Liver Function Tests: Recent Labs  Lab 11/29/18 0526 12/04/18 1840  AST 42* 83*  ALT 36 70*  ALKPHOS 88 103  BILITOT 0.8 1.2  PROT 6.8 6.8  ALBUMIN 4.0 3.7   No results for input(s): LIPASE, AMYLASE in the last 168 hours. No results for input(s): AMMONIA in the last 168 hours. Coagulation Profile: No results for input(s): INR, PROTIME in the last 168 hours. Cardiac Enzymes: No results for input(s): CKTOTAL, CKMB, CKMBINDEX, TROPONINI in the last 168 hours. BNP (last 3 results) No results for input(s): PROBNP in the last 8760 hours. HbA1C: No results for input(s): HGBA1C in the last 72 hours. CBG: No results for input(s): GLUCAP in the last 168 hours. Lipid Profile: No results for input(s): CHOL, HDL, LDLCALC, TRIG, CHOLHDL, LDLDIRECT in the last 72 hours. Thyroid Function Tests:  No results for input(s): TSH, T4TOTAL, FREET4, T3FREE, THYROIDAB in the last 72 hours. Anemia Panel: No results for input(s): VITAMINB12, FOLATE, FERRITIN, TIBC, IRON, RETICCTPCT in the last 72 hours.  --------------------------------------------------------------------------------------------------------------- Urine analysis:    Component Value Date/Time   COLORURINE YELLOW 11/29/2018 0526   APPEARANCEUR CLEAR 11/29/2018 0526   LABSPEC 1.010 11/29/2018 0526   PHURINE 5.0 11/29/2018 0526   GLUCOSEU NEGATIVE 11/29/2018 0526   HGBUR NEGATIVE 11/29/2018 0526   BILIRUBINUR NEGATIVE 11/29/2018 0526   KETONESUR NEGATIVE 11/29/2018 0526   PROTEINUR NEGATIVE 11/29/2018 0526   NITRITE NEGATIVE 11/29/2018 0526   LEUKOCYTESUR NEGATIVE 11/29/2018 0526      Imaging Results:    Dg Chest Portable 1 View  Result Date: 12/04/2018 CLINICAL DATA:  COVID, shortness of breath, hypoxia EXAM: PORTABLE CHEST 1 VIEW COMPARISON:  11/29/2018 FINDINGS: Worsening patchy airspace disease within both lungs, mostly in the mid and lower lungs. Heart is normal size. No effusions. No acute bony abnormality. IMPRESSION: Worsening patchy bilateral airspace disease compatible with pneumonia. Electronically Signed   By: Charlett Nose M.D.   On: 12/04/2018 19:21   nsr at 80, nl axis, nl  int,  No st-t changes c/w ischemia     Assessment & Plan:    Principal Problem:   Acute respiratory failure with hypoxia (HCC) Active Problems:   COVID-19 virus infection   Abnormal liver function   Hypokalemia  Acute respiratory failure with hypoxia Secondary to Covid -19  Covid -19 Check d dimer, fibrinogen, ferritin, ldh, procalcitonin, lactic acid If d dimer is positive then CTA chest r/o PE Hydrate with ns iv Dexamethasone  iv qday (pt told that this is simliar to prednisone and is willing to take) Remdesivir consult  ?CAP (yellow sputum) Blood culture x2 Urine strep antigen Urine legionella antigen Rocephin  1gm iv qday Zithromax  iv qday  Abnormal liver function Check acute hepatitis panel Check RUQ ultrasound  Hypokalemia Replete Check cmp in am    DVT Prophylaxis-   Lovenox - SCDs  AM Labs Ordered, also please review Full Orders  Family Communication: Admission, patients condition and plan of care including tests being ordered have been discussed with the patient  who indicate understanding and agree with the plan and Code Status.  Code Status:  FULL CODE per patient  Admission status: Inpatient: Based on patients clinical presentation and evaluation of above clinical data, I have made determination that patient meets Inpatient criteria at this time.  Pt has acute respiratory failure with hypoxia,  Secondary to covid-19 and has high risk of clinical deterioration. Pt will require iv remdesivir along with iv steroids and > 2 nites stay  Time spent in minutes : 70   Pearson Grippe M.D on 12/04/2018 at 10:11 PM

## 2018-12-04 NOTE — ED Notes (Signed)
Please call spouse Nahun Kronberg @ [phone] a status update--Carl Chang

## 2018-12-04 NOTE — Progress Notes (Signed)
Pharmacy Note - Remdesivir Dosing  O:  ALT: 70 CXR (12/04/2018): worsening patchy bilateral airspace disease compatible with pneumonia.  Requiring supplemental O2: O2 sat 95% on 3L [place]   A: 43 yo male presented on 12/04/2018 with worsening SOB and cough. He was seen at [facility] ED on 11/29/2018 and diagnosed with COVID-19. Patient meets criteria for remdesivir.   P: Begin remdesivir 200 mg IV x 1, followed by 100 mg IV daily x 4 days  Monitor ALT, clinical progress  Cristela Felt, PharmD PGY1 Pharmacy Resident [employer]: [phone]  12/04/2018 8:46 PM

## 2018-12-05 DIAGNOSIS — J1289 Other viral pneumonia: Secondary | ICD-10-CM

## 2018-12-05 LAB — TYPE AND SCREEN
ABO/RH(D): A POS
Antibody Screen: NEGATIVE

## 2018-12-05 LAB — PROCALCITONIN: Procalcitonin: <0.1 ng/mL

## 2018-12-05 LAB — STREP PNEUMONIAE URINARY ANTIGEN: Strep Pneumo Urinary Antigen: NEGATIVE

## 2018-12-05 LAB — C-REACTIVE PROTEIN: CRP: 6.3 mg/dL — ABNORMAL HIGH (ref <1.0)

## 2018-12-05 LAB — HEMOGLOBIN A1C
Hgb A1c MFr Bld: 5.3 % (ref 4.8–5.6)
Mean Plasma Glucose: 105.41 mg/dL

## 2018-12-05 LAB — FERRITIN: Ferritin: 1595 ng/mL — ABNORMAL HIGH (ref 24–336)

## 2018-12-05 MED ORDER — ACETAMINOPHEN 325 MG PO TABS
650.0000 mg | ORAL_TABLET | Freq: Four times a day (QID) | ORAL | Status: DC | PRN
  Administered 2018-12-07: 650 mg via ORAL
  Filled 2018-12-05: qty 2

## 2018-12-05 MED ORDER — IBUPROFEN 400 MG PO TABS
400.0000 mg | ORAL_TABLET | Freq: Four times a day (QID) | ORAL | Status: DC | PRN
  Filled 2018-12-05: qty 1

## 2018-12-05 MED ORDER — DOCUSATE SODIUM 100 MG PO CAPS
100.0000 mg | ORAL_CAPSULE | Freq: Two times a day (BID) | ORAL | Status: DC
  Administered 2018-12-05 – 2018-12-08 (×6): 100 mg via ORAL
  Filled 2018-12-05 (×6): qty 1

## 2018-12-05 MED ORDER — ACETAMINOPHEN 650 MG RE SUPP
650.0000 mg | Freq: Four times a day (QID) | RECTAL | Status: DC | PRN
  Filled 2018-12-05: qty 1

## 2018-12-05 MED ORDER — SODIUM CHLORIDE 0.9 % IV SOLN
100.0000 mg | Freq: Every day | INTRAVENOUS | Status: AC
  Administered 2018-12-05 – 2018-12-08 (×4): 100 mg via INTRAVENOUS
  Filled 2018-12-05: qty 100
  Filled 2018-12-05: qty 20
  Filled 2018-12-05 (×2): qty 100

## 2018-12-05 MED ORDER — POTASSIUM CHLORIDE CRYS ER 20 MEQ PO TBCR
30.0000 meq | EXTENDED_RELEASE_TABLET | Freq: Once | ORAL | Status: AC
  Administered 2018-12-05: 30 meq via ORAL
  Filled 2018-12-05: qty 2

## 2018-12-05 MED ORDER — SODIUM CHLORIDE 0.9% IV SOLUTION
Freq: Once | INTRAVENOUS | Status: AC
  Administered 2018-12-05: 16:00:00 via INTRAVENOUS

## 2018-12-05 MED ORDER — ONDANSETRON HCL 4 MG PO TABS: 4.0000 mg | ORAL_TABLET | Freq: Four times a day (QID) | ORAL | Status: DC | PRN

## 2018-12-05 MED ORDER — ZOLPIDEM TARTRATE 5 MG PO TABS: 5.0000 mg | ORAL_TABLET | Freq: Every evening | ORAL | Status: DC | PRN

## 2018-12-05 MED ORDER — POLYETHYLENE GLYCOL 3350 17 G PO PACK: 17.0000 g | PACK | Freq: Every day | ORAL | Status: DC | PRN

## 2018-12-05 MED ORDER — ONDANSETRON HCL 4 MG/2ML IJ SOLN: 4.0000 mg | Freq: Four times a day (QID) | INTRAMUSCULAR | Status: DC | PRN

## 2018-12-05 MED ORDER — ENOXAPARIN SODIUM 40 MG/0.4ML ~~LOC~~ SOLN: 40.0000 mg | SUBCUTANEOUS | Status: DC

## 2018-12-05 NOTE — ED Notes (Signed)
+  tele  Breakfast ordered 

## 2018-12-05 NOTE — ED Notes (Signed)
Report called  

## 2018-12-05 NOTE — Progress Notes (Addendum)
PROGRESS NOTE  Raynelle Charymil E Kutner MVH:[id number] DOB: 1975/11/21 DOA: 12/04/2018 PCP: Patient, No Pcp Per   LOS: 1 day   Brief Narrative / Interim history: This is a 43 year old male who presents to the hospital with chief complaint of fever, chills, myalgias as well as cough and progressive shortness of breath.  Patient tells me that about 14 days ago he went to a birthday party where she had an ax throwing event.  About 3 days later, 11 days ago, him and multiple of his friends started feeling flulike illness, weakness and overall feeling sick.  He went and got checked on 11/22 and tested positive for SARS-CoV-2.  Patient was at home in the last 6 days however has been progressively more short of breath, experience severe chest tightness with breathing and difficulties even walking a few steps due to shortness of breath and cough.  He came back to the hospital, chest x-ray showed multifocal pneumonia, he was hypoxic requiring 4 L nasal cannula and was admitted to the hospital  Subjective / 24h Interval events: He is not feeling too good right now, complains of pain with deep inspiration, was able to walk to the bathroom and back just a few minutes ago and was completely wiped out.  He has been coughing but not producing much sputum.  Assessment & Plan: Principal Problem:   Acute respiratory failure with hypoxia (HCC) Active Problems:   COVID-19 virus infection   Abnormal liver function   Hypokalemia   CAP (community acquired pneumonia)   Principal Problem Acute Hypoxic Respiratory Failure due to Covid-19 Viral Illness -Patient already started on remdesivir in the emergency room and he is to take 5 doses with last dose on 12/08/2018.  He was started on Decadron 6 mg daily, continue -His inflammatory marker are moderately elevated -Given hypoxia with 4 L requirement, patient consented for plasma today, consent signed and in the chart and will transfuse this afternoon  COVID-19 Labs  Recent  Labs    12/04/18 2027 12/04/18 2237  DDIMER  --  0.95*  FERRITIN  --  1,595*  LDH  --  429*  CRP 5.6*  --     This patient has confirmed COVID-19 in the setting of the ongoing 2020 coronavirus pandemic.  The patient has hypoxia and is high-risk for intubation, but expected to survive >48 hours and has good baseline functional status.  She is not known to be on immunomodulators, anti-rejection medications, or cancer chemotherapy, has no history of TB or latent TB, and no diverticulitis or intestinal perforation.  Platelets are >50K, ANC is >500, and ALT/AST are below 5x ULN with no known hepatitis B infection. The investigational nature of this medication was discussed with the patient and he agrees with the use of this medication. Complete risks and long-term side effects are unknown, however in the best clinical judgment they seem to be of some clinical benefit rather than medical risks.  Patient agrees with the treatment plan  The treatment plan and use of medications and known side effects were discussed with patient/family, they were clearly explained that there is no proven definitive treatment for COVID-19 infection, any medications used here are based on published clinical articles/anecdotal data which are not peer-reviewed or randomized control trials.  Complete risks and long-term side effects are unknown, however in the best clinical judgment they seem to be of some clinical benefit rather than medical risks.  Patient agree with the treatment plan and want to receive the given medications.  Active Problems Hypokalemia -Replete and repeat   Scheduled Meds: . sodium chloride   Intravenous Once  . dexamethasone (DECADRON) injection  6 mg Intravenous Q24H  . docusate sodium  100 mg Oral BID  . enoxaparin (LOVENOX) injection  40 mg Subcutaneous Q24H  . loratadine  10 mg Oral Daily  . multivitamin with minerals  1 tablet Oral Daily  . sertraline  150 mg Oral Daily   Continuous  Infusions: . remdesivir 100 mg in NS 250 mL     PRN Meds:.acetaminophen **OR** acetaminophen, ibuprofen, loperamide, ondansetron **OR** ondansetron (ZOFRAN) IV, polyethylene glycol, zolpidem  DVT prophylaxis: Lovenox Code Status: Full code Family Communication: d/w patient  Disposition Plan: home when ready   Consultants:  None   Procedures:  None   Microbiology: None   Antimicrobials: None    Objective: Vitals:   12/05/18 0730 12/05/18 0745 12/05/18 1310 12/05/18 1358  BP: 127/84 133/77    Pulse: (!) 59 61 68   Resp: 19 19 20    Temp:   98.8 F (37.1 C)   TempSrc:   Oral   SpO2: 90% 91% 93% 93%  Weight:   100 kg   Height:   6' (1.829 m)     Intake/Output Summary (Last 24 hours) at 12/05/2018 1438 Last data filed at 12/05/2018 0649 Gross per 24 hour  Intake 1000 ml  Output 1500 ml  Net -500 ml   Filed Weights   12/05/18 1310  Weight: 100 kg    Examination:  Constitutional: NAD Eyes: no scleral icterus ENMT: Mucous membranes are moist.  Neck: normal, supple Respiratory: Diffuse bilateral rhonchi, no wheezing heard.  Shallow breathing, increased respiratory effort Cardiovascular: Regular rate and rhythm, no murmurs / rubs / gallops. No LE edema.  Abdomen: non distended, no tenderness. Bowel sounds positive.  Musculoskeletal: no clubbing / cyanosis.  Skin: no rashes Neurologic: No focal deficits, equal strength Psychiatric: Normal judgment and insight. Alert and oriented x 3. Normal mood.    Data Reviewed: I have independently reviewed following labs and imaging studies   Chest x-ray personally reviewed, multiple bilateral infiltrates  CBC: Recent Labs  Lab 11/29/18 0526 12/04/18 1840  WBC 5.3 7.8  NEUTROABS 4.1 6.5  HGB 14.6 15.4  HCT 41.6 43.3  MCV 86.8 85.9  PLT 173 025   Basic Metabolic Panel: Recent Labs  Lab 11/29/18 0526 12/04/18 1840  NA 137 140  K 3.2* 3.4*  CL 103 100  CO2 23 24  GLUCOSE 103* 94  BUN 16 14  CREATININE  1.44* 1.19  CALCIUM 8.5* 8.6*   GFR: Estimated Creatinine Clearance: 100.1 mL/min (by C-G formula based on SCr of 1.19 mg/dL). Liver Function Tests: Recent Labs  Lab 11/29/18 0526 12/04/18 1840  AST 42* 83*  ALT 36 70*  ALKPHOS 88 103  BILITOT 0.8 1.2  PROT 6.8 6.8  ALBUMIN 4.0 3.7   No results for input(s): LIPASE, AMYLASE in the last 168 hours. No results for input(s): AMMONIA in the last 168 hours. Coagulation Profile: No results for input(s): INR, PROTIME in the last 168 hours. Cardiac Enzymes: No results for input(s): CKTOTAL, CKMB, CKMBINDEX, TROPONINI in the last 168 hours. BNP (last 3 results) No results for input(s): PROBNP in the last 8760 hours. HbA1C: No results for input(s): HGBA1C in the last 72 hours. CBG: No results for input(s): GLUCAP in the last 168 hours. Lipid Profile: No results for input(s): CHOL, HDL, LDLCALC, TRIG, CHOLHDL, LDLDIRECT in the last 72 hours. Thyroid Function Tests:  No results for input(s): TSH, T4TOTAL, FREET4, T3FREE, THYROIDAB in the last 72 hours. Anemia Panel: Recent Labs    12/04/18 2237  FERRITIN 1,595*   Urine analysis:    Component Value Date/Time   COLORURINE YELLOW 11/29/2018 0526   APPEARANCEUR CLEAR 11/29/2018 0526   LABSPEC 1.010 11/29/2018 0526   PHURINE 5.0 11/29/2018 0526   GLUCOSEU NEGATIVE 11/29/2018 0526   HGBUR NEGATIVE 11/29/2018 0526   BILIRUBINUR NEGATIVE 11/29/2018 0526   KETONESUR NEGATIVE 11/29/2018 0526   PROTEINUR NEGATIVE 11/29/2018 0526   NITRITE NEGATIVE 11/29/2018 0526   LEUKOCYTESUR NEGATIVE 11/29/2018 0526   Sepsis Labs: Invalid input(s): PROCALCITONIN, LACTICIDVEN  Recent Results (from the past 240 hour(s))  Blood Culture (routine x 2)     Status: None   Collection Time: 11/29/18  5:26 AM   Specimen: BLOOD  Result Value Ref Range Status   Specimen Description   Final    BLOOD LEFT ANTECUBITAL Performed at [facility], [address] [place], [place] [address]     Special Requests   Final    BOTTLES DRAWN AEROBIC AND ANAEROBIC Blood Culture results may not be optimal due to an excessive volume of blood received in culture bottles Performed at [facility], [address], [place], [place] [address]    Culture   Final    NO GROWTH 5 DAYS Performed at [facility], [address], [place], [place] [address]    Report Status 12/04/2018 FINAL  Final  Blood Culture (routine x 2)     Status: None   Collection Time: 11/29/18  5:26 AM   Specimen: BLOOD  Result Value Ref Range Status   Specimen Description   Final    BLOOD RIGHT ANTECUBITAL Performed at [facility], [address] [place], [place] [address]    Special Requests   Final    BOTTLES DRAWN AEROBIC AND ANAEROBIC Blood Culture adequate volume Performed at [facility], [address], [place], [place] [address]    Culture   Final    NO GROWTH 5 DAYS Performed at [facility], [address], [place], [place] [address]    Report Status 12/04/2018 FINAL  Final  Urine culture     Status: None   Collection Time: 11/29/18  5:26 AM   Specimen: In/Out Cath Urine  Result Value Ref Range Status   Specimen Description   Final    IN/OUT CATH URINE Performed at [facility], [address], [place], [place] [address]    Special Requests   Final    NONE Performed at [facility], [address], [place], [place] [address]    Culture   Final    NO GROWTH Performed at [facility], [address], [place], [place] [address]    Report Status 11/30/2018 FINAL  Final  Culture, blood (Routine X 2) w Reflex to ID Panel     Status: None (Preliminary result)   Collection Time: 12/04/18  9:22 PM   Specimen: BLOOD RIGHT HAND  Result Value Ref Range Status   Specimen Description BLOOD RIGHT HAND  Final   Special Requests   Final    BOTTLES DRAWN AEROBIC AND ANAEROBIC Blood Culture  adequate volume   Culture   Final    NO GROWTH < 24 HOURS Performed at [facility], [address] [place], [place] [address]    Report Status PENDING  Incomplete  Culture, blood (Routine X 2) w Reflex to ID Panel     Status: None (Preliminary result)   Collection Time: 12/04/18  9:22 PM   Specimen: BLOOD  Result Value Ref Range Status   Specimen Description BLOOD LEFT ANTECUBITAL  Final   Special Requests   Final    BOTTLES DRAWN AEROBIC AND ANAEROBIC Blood Culture adequate volume   Culture   Final    NO GROWTH < 24 HOURS Performed at [facility], [address] [place], [place] [address]    Report Status PENDING  Incomplete      Radiology Studies: Dg Chest Portable 1 View  Result Date: 12/04/2018 CLINICAL DATA:  COVID, shortness of breath, hypoxia EXAM: PORTABLE CHEST 1 VIEW COMPARISON:  11/29/2018 FINDINGS: Worsening patchy airspace disease within both lungs, mostly in the mid and lower lungs. Heart is normal size. No effusions. No acute bony abnormality. IMPRESSION: Worsening patchy bilateral airspace disease compatible with pneumonia. Electronically Signed   By: Charlett Nose M.D.   On: 12/04/2018 19:21   US Abdomen Limited Ruq  Result Date: 12/04/2018 CLINICAL DATA:  Right upper quadrant abdominal pain. EXAM: ULTRASOUND ABDOMEN LIMITED RIGHT UPPER QUADRANT COMPARISON:  None. FINDINGS: Gallbladder: No gallstones or wall thickening visualized. No sonographic Murphy sign noted by sonographer. Common bile duct: Diameter: 5 mm Liver: Diffuse increased echogenicity with slightly heterogeneous liver. Appearance typically secondary to fatty infiltration. Fibrosis secondary consideration. No secondary findings of cirrhosis noted. No focal hepatic lesion or intrahepatic biliary duct dilatation. Portal vein is patent on color Doppler imaging with normal direction of blood flow towards the liver. Other: None. IMPRESSION: 1. No acute sonographic abnormality. No evidence for  cholelithiasis or acute cholecystitis. 2. Hepatic steatosis. Electronically Signed   By: Katherine Mantle M.D.   On: 12/04/2018 23:57     Pamella Pert, MD, PhD [facility]  Contact via  [email or web address]  [facility] Office Info P: [phone] F: [phone]

## 2018-12-05 NOTE — ED Notes (Signed)
Lunch Tray Ordered @ 1029. 

## 2018-12-06 LAB — CBC
HCT: 41.2 % (ref 39.0–52.0)
Hemoglobin: 14.2 g/dL (ref 13.0–17.0)
MCH: 29.8 pg (ref 26.0–34.0)
MCHC: 34.5 g/dL (ref 30.0–36.0)
MCV: 86.6 fL (ref 80.0–100.0)
Platelets: 309 10*3/uL (ref 150–400)
RBC: 4.76 MIL/uL (ref 4.22–5.81)
RDW: 11.9 % (ref 11.5–15.5)
WBC: 7.4 10*3/uL (ref 4.0–10.5)
nRBC: 0 % (ref 0.0–0.2)

## 2018-12-06 LAB — COMPREHENSIVE METABOLIC PANEL
ALT: 82 U/L — ABNORMAL HIGH (ref 0–44)
AST: 85 U/L — ABNORMAL HIGH (ref 15–41)
Albumin: 3.7 g/dL (ref 3.5–5.0)
Alkaline Phosphatase: 94 U/L (ref 38–126)
Anion gap: 10 (ref 5–15)
BUN: 19 mg/dL (ref 6–20)
CO2: 24 mmol/L (ref 22–32)
Calcium: 8.4 mg/dL — ABNORMAL LOW (ref 8.9–10.3)
Chloride: 106 mmol/L (ref 98–111)
Creatinine, Ser: 0.84 mg/dL (ref 0.61–1.24)
GFR calc Af Amer: >60 mL/min (ref >60)
GFR calc non Af Amer: >60 mL/min (ref >60)
Glucose, Bld: 132 mg/dL — ABNORMAL HIGH (ref 70–99)
Potassium: 3.8 mmol/L (ref 3.5–5.1)
Sodium: 140 mmol/L (ref 135–145)
Total Bilirubin: 1.1 mg/dL (ref 0.3–1.2)
Total Protein: 6.7 g/dL (ref 6.5–8.1)

## 2018-12-06 LAB — LEGIONELLA PNEUMOPHILA SEROGP 1 UR AG: L. pneumophila Serogp 1 Ur Ag: NEGATIVE

## 2018-12-06 LAB — D-DIMER, QUANTITATIVE: D-Dimer, Quant: 0.7 ug/mL-FEU — ABNORMAL HIGH (ref 0.00–0.50)

## 2018-12-06 LAB — C-REACTIVE PROTEIN: CRP: 3.2 mg/dL — ABNORMAL HIGH (ref <1.0)

## 2018-12-06 LAB — FERRITIN: Ferritin: 1136 ng/mL — ABNORMAL HIGH (ref 24–336)

## 2018-12-06 LAB — HIV ANTIBODY (ROUTINE TESTING W REFLEX): HIV Screen 4th Generation wRfx: NONREACTIVE

## 2018-12-06 NOTE — Plan of Care (Signed)
  Problem: Respiratory: Goal: Will maintain a patent airway Outcome: Progressing Goal: Complications related to the disease process, condition or treatment will be avoided or minimized Outcome: Progressing   

## 2018-12-06 NOTE — Progress Notes (Signed)
PROGRESS NOTE  Carl Chang VOZ:[id number] DOB: 1975-02-07 DOA: 12/04/2018 PCP: Patient, No Pcp Per   LOS: 2 days   Brief Narrative / Interim history: This is a 43 year old male who presents to the hospital with chief complaint of fever, chills, myalgias as well as cough and progressive shortness of breath.  Patient tells me that about 14 days ago he went to a birthday party where she had an ax throwing event.  About 3 days later, 11 days ago, him and multiple of his friends started feeling flulike illness, weakness and overall feeling sick.  He went and got checked on 11/22 and tested positive for SARS-CoV-2.  Patient was at home in the last 6 days however has been progressively more short of breath, experience severe chest tightness with breathing and difficulties even walking a few steps due to shortness of breath and cough.  He came back to the hospital, chest x-ray showed multifocal pneumonia, he was hypoxic requiring 4 L nasal cannula and was admitted to the hospital  Subjective / 24h Interval events: Still complains of pain with deep inspiration but overall feels a little bit better today.  No abdominal pain, no nausea or vomiting.  Assessment & Plan: Principal Problem:   Acute respiratory failure with hypoxia (HCC) Active Problems:   COVID-19 virus infection   Abnormal liver function   Hypokalemia   CAP (community acquired pneumonia)   Principal Problem Acute Hypoxic Respiratory Failure due to Covid-19 Viral Illness -Patient already started on remdesivir in the emergency room and he is to take 5 doses with last dose on 12/08/2018.  He was started on Decadron 6 mg daily, continue -His inflammatory markers seem to be improving today -Status post convalescent plasma 11/28 -Clinically better  COVID-19 Labs  Recent Labs    12/04/18 2027 12/04/18 2237 12/05/18 1600 12/06/18 0404  DDIMER  --  0.95*  --  0.70*  FERRITIN  --  1,595*  --  1,136*  LDH  --  429*  --   --   CRP  5.6*  --  6.3* 3.2*    Active Problems Hypokalemia -Resolved   Scheduled Meds: . dexamethasone (DECADRON) injection  6 mg Intravenous Q24H  . docusate sodium  100 mg Oral BID  . loratadine  10 mg Oral Daily  . multivitamin with minerals  1 tablet Oral Daily  . sertraline  150 mg Oral Daily   Continuous Infusions: . remdesivir 100 mg in NS 250 mL 100 mg (12/06/18 1013)   PRN Meds:.acetaminophen **OR** acetaminophen, ibuprofen, loperamide, ondansetron **OR** ondansetron (ZOFRAN) IV, polyethylene glycol, zolpidem  DVT prophylaxis: Lovenox Code Status: Full code Family Communication: d/w patient  Disposition Plan: home when ready   Consultants:  None   Procedures:  None   Microbiology: None   Antimicrobials: None    Objective: Vitals:   12/06/18 0249 12/06/18 0500 12/06/18 0755 12/06/18 0852  BP: 123/68 124/70 119/79   Pulse: 77 65 (!) 54   Resp: 17 15 18    Temp: 98.3 F (36.8 C) 98.3 F (36.8 C) 98.1 F (36.7 C)   TempSrc: Oral Oral Oral   SpO2: 94% 95% 93% 92%  Weight:      Height:        Intake/Output Summary (Last 24 hours) at 12/06/2018 1110 Last data filed at 12/06/2018 0600 Gross per 24 hour  Intake 1646 ml  Output 2000 ml  Net -354 ml   Filed Weights   12/05/18 1310  Weight: 100 kg  Examination:  Constitutional: No distress Eyes: No scleral icterus ENMT: Moist mucous membranes Neck: normal, supple Respiratory: Diffuse bilateral rhonchi, no wheezing, shallow breathing with increased respiratory effort Cardiovascular: Regular rate and rhythm, no murmurs, no edema Abdomen: Soft, NT, ND, positive bowel sounds Musculoskeletal: no clubbing / cyanosis.  Skin: No rashes seen Neurologic: No focal deficits, equal strength    Data Reviewed: I have independently reviewed following labs and imaging studies   Chest x-ray personally reviewed, multiple bilateral infiltrates  CBC: Recent Labs  Lab 12/04/18 1840 12/06/18 0404  WBC 7.8 7.4   NEUTROABS 6.5  --   HGB 15.4 14.2  HCT 43.3 41.2  MCV 85.9 86.6  PLT 250 309   Basic Metabolic Panel: Recent Labs  Lab 12/04/18 1840 12/06/18 0404  NA 140 140  K 3.4* 3.8  CL 100 106  CO2 24 24  GLUCOSE 94 132*  BUN 14 19  CREATININE 1.19 0.84  CALCIUM 8.6* 8.4*   GFR: Estimated Creatinine Clearance: 141.8 mL/min (by C-G formula based on SCr of 0.84 mg/dL). Liver Function Tests: Recent Labs  Lab 12/04/18 1840 12/06/18 0404  AST 83* 85*  ALT 70* 82*  ALKPHOS 103 94  BILITOT 1.2 1.1  PROT 6.8 6.7  ALBUMIN 3.7 3.7   No results for input(s): LIPASE, AMYLASE in the last 168 hours. No results for input(s): AMMONIA in the last 168 hours. Coagulation Profile: No results for input(s): INR, PROTIME in the last 168 hours. Cardiac Enzymes: No results for input(s): CKTOTAL, CKMB, CKMBINDEX, TROPONINI in the last 168 hours. BNP (last 3 results) No results for input(s): PROBNP in the last 8760 hours. HbA1C: Recent Labs    12/05/18 1600  HGBA1C 5.3   CBG: No results for input(s): GLUCAP in the last 168 hours. Lipid Profile: No results for input(s): CHOL, HDL, LDLCALC, TRIG, CHOLHDL, LDLDIRECT in the last 72 hours. Thyroid Function Tests: No results for input(s): TSH, T4TOTAL, FREET4, T3FREE, THYROIDAB in the last 72 hours. Anemia Panel: Recent Labs    12/04/18 2237 12/06/18 0404  FERRITIN 1,595* 1,136*   Urine analysis:    Component Value Date/Time   COLORURINE YELLOW 11/29/2018 0526   APPEARANCEUR CLEAR 11/29/2018 0526   LABSPEC 1.010 11/29/2018 0526   PHURINE 5.0 11/29/2018 0526   GLUCOSEU NEGATIVE 11/29/2018 0526   HGBUR NEGATIVE 11/29/2018 0526   BILIRUBINUR NEGATIVE 11/29/2018 0526   KETONESUR NEGATIVE 11/29/2018 0526   PROTEINUR NEGATIVE 11/29/2018 0526   NITRITE NEGATIVE 11/29/2018 0526   LEUKOCYTESUR NEGATIVE 11/29/2018 0526   Sepsis Labs: Invalid input(s): PROCALCITONIN, LACTICIDVEN  Recent Results (from the past 240 hour(s))  Blood Culture  (routine x 2)     Status: None   Collection Time: 11/29/18  5:26 AM   Specimen: BLOOD  Result Value Ref Range Status   Specimen Description   Final    BLOOD LEFT ANTECUBITAL Performed at [facility], [address] [place], [place] [address]    Special Requests   Final    BOTTLES DRAWN AEROBIC AND ANAEROBIC Blood Culture results may not be optimal due to an excessive volume of blood received in culture bottles Performed at [facility], [address] [place], [place] [address]    Culture   Final    NO GROWTH 5 DAYS Performed at [facility], [address], [place], [place] [address]    Report Status 12/04/2018 FINAL  Final  Blood Culture (routine x 2)     Status: None  Collection Time: 11/29/18  5:26 AM   Specimen: BLOOD  Result Value Ref Range Status   Specimen Description   Final    BLOOD RIGHT ANTECUBITAL Performed at [facility], [address], [place], [place] [address]    Special Requests   Final    BOTTLES DRAWN AEROBIC AND ANAEROBIC Blood Culture adequate volume Performed at [facility], [address] [place], [place] [address]    Culture   Final    NO GROWTH 5 DAYS Performed at [facility], [address] [place], [place] [address]    Report Status 12/04/2018 FINAL  Final  Urine culture     Status: None   Collection Time: 11/29/18  5:26 AM   Specimen: In/Out Cath Urine  Result Value Ref Range Status   Specimen Description   Final    IN/OUT CATH URINE Performed at [facility], [address], [place], [place] [address]    Special Requests   Final    NONE Performed at [facility], [address] [place], [place] [address]    Culture   Final    NO GROWTH Performed at [facility], [address] [place], [place] [address]    Report Status 11/30/2018 FINAL  Final  Culture, blood (Routine X 2) w Reflex to ID Panel      Status: None (Preliminary result)   Collection Time: 12/04/18  9:22 PM   Specimen: BLOOD RIGHT HAND  Result Value Ref Range Status   Specimen Description BLOOD RIGHT HAND  Final   Special Requests   Final    BOTTLES DRAWN AEROBIC AND ANAEROBIC Blood Culture adequate volume   Culture   Final    NO GROWTH 2 DAYS Performed at [facility], [address], [place], [place] [address]    Report Status PENDING  Incomplete  Culture, blood (Routine X 2) w Reflex to ID Panel     Status: None (Preliminary result)   Collection Time: 12/04/18  9:22 PM   Specimen: BLOOD  Result Value Ref Range Status   Specimen Description BLOOD LEFT ANTECUBITAL  Final   Special Requests   Final    BOTTLES DRAWN AEROBIC AND ANAEROBIC Blood Culture adequate volume   Culture   Final    NO GROWTH 2 DAYS Performed at [facility], [address] [place], [place] [address]    Report Status PENDING  Incomplete      Radiology Studies: Dg Chest Portable 1 View  Result Date: 12/04/2018 CLINICAL DATA:  COVID, shortness of breath, hypoxia EXAM: PORTABLE CHEST 1 VIEW COMPARISON:  11/29/2018 FINDINGS: Worsening patchy airspace disease within both lungs, mostly in the mid and lower lungs. Heart is normal size. No effusions. No acute bony abnormality. IMPRESSION: Worsening patchy bilateral airspace disease compatible with pneumonia. Electronically Signed   By: Charlett Nose M.D.   On: 12/04/2018 19:21   US Abdomen Limited Ruq  Result Date: 12/04/2018 CLINICAL DATA:  Right upper quadrant abdominal pain. EXAM: ULTRASOUND ABDOMEN LIMITED RIGHT UPPER QUADRANT COMPARISON:  None. FINDINGS: Gallbladder: No gallstones or wall thickening visualized. No sonographic Murphy sign noted by sonographer. Common bile duct: Diameter: 5 mm Liver: Diffuse increased echogenicity with slightly heterogeneous liver. Appearance typically secondary to fatty infiltration. Fibrosis secondary consideration. No secondary findings of  cirrhosis noted. No focal hepatic lesion or intrahepatic biliary duct dilatation. Portal vein is patent on color Doppler imaging with normal direction of  blood flow towards the liver. Other: None. IMPRESSION: 1. No acute sonographic abnormality. No evidence for cholelithiasis or acute cholecystitis. 2. Hepatic steatosis. Electronically Signed   By: Katherine Mantlehristopher  Green M.D.   On: 12/04/2018 23:57     Pamella Pertostin Gherghe, MD, PhD [facility]  Contact via  [email or web address]  [facility] Office Info P: [phone] F: [phone]

## 2018-12-07 LAB — PREPARE FRESH FROZEN PLASMA: Unit division: 0

## 2018-12-07 LAB — C-REACTIVE PROTEIN: CRP: 1.4 mg/dL — ABNORMAL HIGH (ref <1.0)

## 2018-12-07 LAB — COMPREHENSIVE METABOLIC PANEL
ALT: 94 U/L — ABNORMAL HIGH (ref 0–44)
AST: 82 U/L — ABNORMAL HIGH (ref 15–41)
Albumin: 3.7 g/dL (ref 3.5–5.0)
Alkaline Phosphatase: 97 U/L (ref 38–126)
Anion gap: 10 (ref 5–15)
BUN: 23 mg/dL — ABNORMAL HIGH (ref 6–20)
CO2: 24 mmol/L (ref 22–32)
Calcium: 8.7 mg/dL — ABNORMAL LOW (ref 8.9–10.3)
Chloride: 106 mmol/L (ref 98–111)
Creatinine, Ser: 0.96 mg/dL (ref 0.61–1.24)
GFR calc Af Amer: >60 mL/min (ref >60)
GFR calc non Af Amer: >60 mL/min (ref >60)
Glucose, Bld: 134 mg/dL — ABNORMAL HIGH (ref 70–99)
Potassium: 4 mmol/L (ref 3.5–5.1)
Sodium: 140 mmol/L (ref 135–145)
Total Bilirubin: 0.8 mg/dL (ref 0.3–1.2)
Total Protein: 6.9 g/dL (ref 6.5–8.1)

## 2018-12-07 LAB — D-DIMER, QUANTITATIVE: D-Dimer, Quant: 0.52 ug/mL-FEU — ABNORMAL HIGH (ref 0.00–0.50)

## 2018-12-07 LAB — BPAM FFP
Blood Product Expiration Date: 202011292140
ISSUE DATE / TIME: 202011282216
Unit Type and Rh: 6200

## 2018-12-07 LAB — CBC
HCT: 42.4 % (ref 39.0–52.0)
Hemoglobin: 14.3 g/dL (ref 13.0–17.0)
MCH: 29.5 pg (ref 26.0–34.0)
MCHC: 33.7 g/dL (ref 30.0–36.0)
MCV: 87.4 fL (ref 80.0–100.0)
Platelets: 356 10*3/uL (ref 150–400)
RBC: 4.85 MIL/uL (ref 4.22–5.81)
RDW: 11.9 % (ref 11.5–15.5)
WBC: 9.7 10*3/uL (ref 4.0–10.5)
nRBC: 0 % (ref 0.0–0.2)

## 2018-12-07 LAB — MAGNESIUM: Magnesium: 2.2 mg/dL (ref 1.7–2.4)

## 2018-12-07 MED ORDER — ENSURE ENLIVE PO LIQD: 237.0000 mL | Freq: Two times a day (BID) | ORAL | Status: DC

## 2018-12-07 MED ORDER — ENOXAPARIN SODIUM 40 MG/0.4ML ~~LOC~~ SOLN
40.0000 mg | Freq: Every day | SUBCUTANEOUS | Status: DC
  Administered 2018-12-07 – 2018-12-08 (×2): 40 mg via SUBCUTANEOUS
  Filled 2018-12-07 (×2): qty 0.4

## 2018-12-07 NOTE — Progress Notes (Signed)
Walk test performed with pt.  o2 before ambulation was 96% on room air.  Pt ambulated for a total of 5 mins.  Pt's o2 ranged from 90-96% on room air.  Pt's o2 post ambulation was 95% on room air.  Pt denies chest pain and shortness of breath during ambulation.  Will continue to monitor.

## 2018-12-07 NOTE — Plan of Care (Signed)
Discussed with patient plan of care for the evening, pain management and importance of updating [place] of any new signs or symptoms he experiences with some teach back displayed.  Patient will update family hisself

## 2018-12-07 NOTE — Progress Notes (Signed)
Initial Nutrition Assessment  DOCUMENTATION CODES:   Not applicable  INTERVENTION:    Ensure Enlive po BID, each supplement provides 350 kcal and 20 grams of protein  Continue MVI daily  NUTRITION DIAGNOSIS:   Increased nutrient needs related to acute illness(COVID-19) as evidenced by estimated needs.  GOAL:   Patient will meet greater than or equal to 90% of their needs  MONITOR:   PO intake, Supplement acceptance, Labs  REASON FOR ASSESSMENT:   Malnutrition Screening Tool    ASSESSMENT:   43 yo male admitted with fever, chills, myalgias, cough, SOB. Tested positive for COVID-19 on 11/22. PMH includes hayfever.    Patient is currently on a regular diet. PO intake of meals has not been recorded.   Labs reviewed.  Medications reviewed and include decadron, colace, MVI.   Usual weights reviewed. Patient has lost 10% of usual weight within one week. Recent poor intake with acute illness.  Patient would benefit from PO supplements to ensure adequate intake to meet increased nutrition needs for COVID-19.  NUTRITION - FOCUSED PHYSICAL EXAM:  deferred  Diet Order:   Diet Order            Diet regular Room service appropriate? Yes; Fluid consistency: Thin  Diet effective now              EDUCATION NEEDS:   No education needs have been identified at this time  Skin:  Skin Assessment: Reviewed RN Assessment  Last BM:  11/27  Height:   Ht Readings from Last 1 Encounters:  12/05/18 6' (1.829 m)    Weight:   Wt Readings from Last 1 Encounters:  12/05/18 100 kg    Ideal Body Weight:  80.9 kg  BMI:  Body mass index is 29.9 kg/m.  Estimated Nutritional Needs:   Kcal:  1610-9604  Protein:  120-140 gm  Fluid:  >/= 2.4 L    Molli Barrows, RD, LDN, [place] Pager [phone] After Hours Pager [phone]

## 2018-12-07 NOTE — Plan of Care (Signed)
  Problem: Respiratory: Goal: Will maintain a patent airway Outcome: Progressing Goal: Complications related to the disease process, condition or treatment will be avoided or minimized Outcome: Progressing   

## 2018-12-07 NOTE — Progress Notes (Signed)
PROGRESS NOTE  Carl Chang GEX:[id number] DOB: 20-Apr-1975 DOA: 12/04/2018 PCP: Patient, No Pcp Per   LOS: 3 days   Brief Narrative / Interim history: This is a 43 year old male who presents to the hospital with chief complaint of fever, chills, myalgias as well as cough and progressive shortness of breath.  Patient tells me that about 14 days ago he went to a birthday party where she had an ax throwing event.  About 3 days later, 11 days ago, him and multiple of his friends started feeling flulike illness, weakness and overall feeling sick.  He went and got checked on 11/22 and tested positive for SARS-CoV-2.  Patient was at home in the last 6 days however has been progressively more short of breath, experience severe chest tightness with breathing and difficulties even walking a few steps due to shortness of breath and cough.  He came back to the hospital, chest x-ray showed multifocal pneumonia, he was hypoxic requiring 4 L nasal cannula and was admitted to the hospital  Subjective / 24h Interval events: Feeling better.  Off oxygen.  Still with chest pain with coughing and deep breathing but better  Assessment & Plan: Principal Problem:   Acute respiratory failure with hypoxia (HCC) Active Problems:   COVID-19 virus infection   Abnormal liver function   Hypokalemia   CAP (community acquired pneumonia)   Principal Problem Acute Hypoxic Respiratory Failure due to Covid-19 Viral Illness -Patient already started on remdesivir in the emergency room and he is to take 5 doses with last dose on 12/08/2018.  He was started on Decadron 6 mg daily, continue -His inflammatory markers seem to be improving today -Status post convalescent plasma 11/28 -Clinically better, on room air this morning, anticipate home discharge tomorrow  COVID-19 Labs  Recent Labs    12/04/18 2237 12/05/18 1600 12/06/18 0404 12/07/18 0230  DDIMER 0.95*  --  0.70* 0.52*  FERRITIN 1,595*  --  1,136*  --   LDH  429*  --   --   --   CRP  --  6.3* 3.2* 1.4*    Active Problems Hypokalemia -Resolved   Scheduled Meds: . dexamethasone (DECADRON) injection  6 mg Intravenous Q24H  . docusate sodium  100 mg Oral BID  . enoxaparin (LOVENOX) injection  40 mg Subcutaneous Daily  . feeding supplement (ENSURE ENLIVE)  237 mL Oral BID BM  . loratadine  10 mg Oral Daily  . multivitamin with minerals  1 tablet Oral Daily  . sertraline  150 mg Oral Daily   Continuous Infusions: . remdesivir 100 mg in NS 250 mL 100 mg (12/07/18 1011)   PRN Meds:.acetaminophen **OR** acetaminophen, ibuprofen, loperamide, ondansetron **OR** ondansetron (ZOFRAN) IV, polyethylene glycol, zolpidem  DVT prophylaxis: Lovenox Code Status: Full code Family Communication: d/w patient  Disposition Plan: home when ready   Consultants:  None   Procedures:  None   Microbiology: None   Antimicrobials: None    Objective: Vitals:   12/06/18 2000 12/06/18 2100 12/07/18 0400 12/07/18 0830  BP: (!) 117/58  124/71 140/74  Pulse: 62  72 61  Resp: 17  18 18   Temp: 98.4 F (36.9 C)  98.2 F (36.8 C) 98.5 F (36.9 C)  TempSrc: Oral  Oral Oral  SpO2: 95% 96% 95% 96%  Weight:      Height:        Intake/Output Summary (Last 24 hours) at 12/07/2018 1355 Last data filed at 12/07/2018 0300 Gross per 24 hour  Intake 400  ml  Output -  Net 400 ml   Filed Weights   12/05/18 1310  Weight: 100 kg    Examination:  Constitutional: No distress, in bed Eyes: No icterus ENMT: mmm Neck: normal, supple Respiratory: Diminished at the bases, no wheezing, good respiratory effort followed by cough with each breath Cardiovascular: Regular rate and rhythm, no murmurs Abdomen: Soft, NT, ND, positive bowel sounds Musculoskeletal: no clubbing / cyanosis.  Skin: No rashes Neurologic: No focal deficits, equal strength    Data Reviewed: I have independently reviewed following labs and imaging studies   Chest x-ray personally  reviewed, multiple bilateral infiltrates  CBC: Recent Labs  Lab 12/04/18 1840 12/06/18 0404 12/07/18 0230  WBC 7.8 7.4 9.7  NEUTROABS 6.5  --   --   HGB 15.4 14.2 14.3  HCT 43.3 41.2 42.4  MCV 85.9 86.6 87.4  PLT 250 309 098   Basic Metabolic Panel: Recent Labs  Lab 12/04/18 1840 12/06/18 0404 12/07/18 0230  NA 140 140 140  K 3.4* 3.8 4.0  CL 100 106 106  CO2 24 24 24   GLUCOSE 94 132* 134*  BUN 14 19 23*  CREATININE 1.19 0.84 0.96  CALCIUM 8.6* 8.4* 8.7*  MG  --   --  2.2   GFR: Estimated Creatinine Clearance: 124 mL/min (by C-G formula based on SCr of 0.96 mg/dL). Liver Function Tests: Recent Labs  Lab 12/04/18 1840 12/06/18 0404 12/07/18 0230  AST 83* 85* 82*  ALT 70* 82* 94*  ALKPHOS 103 94 97  BILITOT 1.2 1.1 0.8  PROT 6.8 6.7 6.9  ALBUMIN 3.7 3.7 3.7   No results for input(s): LIPASE, AMYLASE in the last 168 hours. No results for input(s): AMMONIA in the last 168 hours. Coagulation Profile: No results for input(s): INR, PROTIME in the last 168 hours. Cardiac Enzymes: No results for input(s): CKTOTAL, CKMB, CKMBINDEX, TROPONINI in the last 168 hours. BNP (last 3 results) No results for input(s): PROBNP in the last 8760 hours. HbA1C: Recent Labs    12/05/18 1600  HGBA1C 5.3   CBG: No results for input(s): GLUCAP in the last 168 hours. Lipid Profile: No results for input(s): CHOL, HDL, LDLCALC, TRIG, CHOLHDL, LDLDIRECT in the last 72 hours. Thyroid Function Tests: No results for input(s): TSH, T4TOTAL, FREET4, T3FREE, THYROIDAB in the last 72 hours. Anemia Panel: Recent Labs    12/04/18 2237 12/06/18 0404  FERRITIN 1,595* 1,136*   Urine analysis:    Component Value Date/Time   COLORURINE YELLOW 11/29/2018 0526   APPEARANCEUR CLEAR 11/29/2018 0526   LABSPEC 1.010 11/29/2018 0526   PHURINE 5.0 11/29/2018 0526   GLUCOSEU NEGATIVE 11/29/2018 0526   HGBUR NEGATIVE 11/29/2018 0526   BILIRUBINUR NEGATIVE 11/29/2018 0526   KETONESUR  NEGATIVE 11/29/2018 0526   PROTEINUR NEGATIVE 11/29/2018 0526   NITRITE NEGATIVE 11/29/2018 0526   LEUKOCYTESUR NEGATIVE 11/29/2018 0526   Sepsis Labs: Invalid input(s): PROCALCITONIN, LACTICIDVEN  Recent Results (from the past 240 hour(s))  Blood Culture (routine x 2)     Status: None   Collection Time: 11/29/18  5:26 AM   Specimen: BLOOD  Result Value Ref Range Status   Specimen Description   Final    BLOOD LEFT ANTECUBITAL Performed at [facility] [address], [place], [place] [address]    Special Requests   Final    BOTTLES DRAWN AEROBIC AND ANAEROBIC Blood Culture results may not be optimal due to an excessive volume of blood received in culture bottles Performed at  [facility], [address], [place], [place] [address]    Culture   Final    NO GROWTH 5 DAYS Performed at [facility], [address], [place], [place] [address]    Report Status 12/04/2018 FINAL  Final  Blood Culture (routine x 2)     Status: None   Collection Time: 11/29/18  5:26 AM   Specimen: BLOOD  Result Value Ref Range Status   Specimen Description   Final    BLOOD RIGHT ANTECUBITAL Performed at [facility], [address], [place], [place] [address]    Special Requests   Final    BOTTLES DRAWN AEROBIC AND ANAEROBIC Blood Culture adequate volume Performed at [facility], [address], [place], [place] [address]    Culture   Final    NO GROWTH 5 DAYS Performed at [facility], [address], [place], [place] [address]    Report Status 12/04/2018 FINAL  Final  Urine culture     Status: None   Collection Time: 11/29/18  5:26 AM   Specimen: In/Out Cath Urine  Result Value Ref Range Status   Specimen Description   Final    IN/OUT CATH URINE Performed at [facility], [address] [place], [place] [address]    Special Requests   Final    NONE Performed at [facility], [address], [place], [place] [address]    Culture   Final    NO GROWTH Performed at [facility], [address], [place], [place] [address]    Report Status 11/30/2018 FINAL  Final  Culture, blood (Routine X 2) w Reflex to ID Panel     Status: None (Preliminary result)   Collection Time: 12/04/18  9:22 PM   Specimen: BLOOD RIGHT HAND  Result Value Ref Range Status   Specimen Description BLOOD RIGHT HAND  Final   Special Requests   Final    BOTTLES DRAWN AEROBIC AND ANAEROBIC Blood Culture adequate volume   Culture   Final    NO GROWTH 3 DAYS Performed at [facility], [address], [place], [place] [address]    Report Status PENDING  Incomplete  Culture, blood (Routine X 2) w Reflex to ID Panel     Status: None (Preliminary result)   Collection Time: 12/04/18  9:22 PM   Specimen: BLOOD  Result Value Ref Range Status   Specimen Description BLOOD LEFT ANTECUBITAL  Final   Special Requests   Final    BOTTLES DRAWN AEROBIC AND ANAEROBIC Blood Culture adequate volume   Culture   Final    NO GROWTH 3 DAYS Performed at [facility], [address] [place], [place] [address]    Report Status PENDING  Incomplete      Radiology Studies: No results found.   Pamella Pert, MD, PhD [facility]  Contact via  [email or web address]  [facility] Office Info P: [phone] F: [phone]

## 2018-12-08 MED ORDER — GUAIFENESIN-DM 100-10 MG/5ML PO SYRP: 5.0000 mL | ORAL_SOLUTION | ORAL | 0 refills | Status: DC | PRN

## 2018-12-08 MED ORDER — DEXAMETHASONE 6 MG PO TABS: 6.0000 mg | ORAL_TABLET | Freq: Every day | ORAL | 0 refills | Status: DC

## 2018-12-08 NOTE — Discharge Summary (Signed)
Physician Discharge Summary  Carl Chang WUJ:[id number] DOB: 08-27-1975 DOA: 12/04/2018  PCP: Patient, No Pcp Per  Admit date: 12/04/2018 Discharge date: 12/08/2018  Admitted From: Home Disposition: Home  Recommendations for Outpatient Follow-up:  1. Follow up with PCP in 1-2 weeks 2. Continue Decadron for 5 additional days on discharge  Home Health: None Equipment/Devices: None  Discharge Condition: Stable CODE STATUS: Full code Diet recommendation: Regular diet  HPI: Per admitting MD, Carl Chang  is a 43 y.o. male, covid-19 positive  w c/o fever for the past 11 days, along with dyspnea.  Pt has had diarrhea as well.  + cough w yellow sputum for the past few days.   Pt tested + in ED for covid on 11/29/2018.   Pt presented to ED tonight because pox <90% at home. Pt thinks that got it at an axe throwing event.  Pt denies cp, palp, n/v, abd pain, brbpr, dysuria, hematuria. In ED, T 99.3, P 78 R 20, Bp 127/83  Pox 90% on RA,  96% on 3L [place] Na 140, K 3.4, Bun 14, Creatinine 1.19 Ast 83, Alt 70 WBc 7.8, Hgb 15.4, Plt 250 CXR IMPRESSION: Worsening patchy bilateral airspace disease compatible with pneumonia. Ekg nsr at 80, nl axis, nl int, no st-t changes c/w ischemia Pt will be admitted for acute respiratory failure with hypoxia secondary to covid -19 as well as abnormal liver function and hypokalemia.   Hospital Course / Discharge diagnoses: Acute Hypoxic Respiratory Failure due to Covid-19 Viral Illness -patient admitted to the hospital requiring 4L [place], CXR on admission showed multifocal pneumonia. He was started on Remdesivir and finished 5 day course while hospitalized. He was started on steroids, completed 5 days in the hospital with 5 days remaining on d/c. Given hypoxia he was consented and administered convalescent plasma on 11/28. He clinically improved, was able to be weaned to room air, has returned to baseline, able to ambulate without difficulties and was discharged home in  stable condition.   COVID-19 Labs  Recent Labs    12/05/18 1600 12/06/18 0404 12/07/18 0230  DDIMER  --  0.70* 0.52*  FERRITIN  --  1,136*  --   CRP 6.3* 3.2* 1.4*    Lab Results  Component Value Date   SARSCOV2NAA Not Detected 11/25/2018   Hypokalemia -Resolved  Discharge Instructions  Allergies as of 12/08/2018      Reactions   Prednisone Other (See Comments)   hiccups      Medication List    STOP taking these medications   ibuprofen 200 MG tablet Commonly known as: ADVIL     TAKE these medications   acetaminophen 325 MG tablet Commonly known as: TYLENOL Take 650 mg by mouth every 6 (six) hours as needed for moderate pain.   dexamethasone 6 MG tablet Commonly known as: DECADRON Take 1 tablet (6 mg total) by mouth daily.   fexofenadine 180 MG tablet Commonly known as: ALLEGRA Take 180 mg by mouth daily.   guaiFENesin-dextromethorphan 100-10 MG/5ML syrup Commonly known as: ROBITUSSIN DM Take 5 mLs by mouth every 4 (four) hours as needed for cough.   loperamide 2 MG capsule Commonly known as: IMODIUM Take 2 mg by mouth as needed for diarrhea or loose stools.   multivitamin with minerals Tabs tablet Take 1 tablet by mouth daily.   NyQuil HBP Cold & Flu 15-6.25-325 MG/15ML Liqd Generic drug: DM-Doxylamine-Acetaminophen Take 30 mLs by mouth at bedtime as needed (sleep/cough).   sertraline 100 MG tablet  Commonly known as: ZOLOFT Take 150 mg by mouth daily.      Consultations:  None   Procedures/Studies:  Dg Chest Portable 1 View  Result Date: 12/04/2018 CLINICAL DATA:  COVID, shortness of breath, hypoxia EXAM: PORTABLE CHEST 1 VIEW COMPARISON:  11/29/2018 FINDINGS: Worsening patchy airspace disease within both lungs, mostly in the mid and lower lungs. Heart is normal size. No effusions. No acute bony abnormality. IMPRESSION: Worsening patchy bilateral airspace disease compatible with pneumonia. Electronically Signed   By: Charlett Nose M.D.    On: 12/04/2018 19:21   Dg Chest Port 1 View  Result Date: 11/29/2018 CLINICAL DATA:  Fever and chills.  Myalgia EXAM: PORTABLE CHEST 1 VIEW COMPARISON:  None. FINDINGS: Artifact from EKG leads. Low volume chest. There is no edema, consolidation, effusion, or pneumothorax. Normal heart size and mediastinal contours. IMPRESSION: Limited low volume chest without visible pneumonia. Electronically Signed   By: Marnee Spring M.D.   On: 11/29/2018 06:02   US Abdomen Limited Ruq  Result Date: 12/04/2018 CLINICAL DATA:  Right upper quadrant abdominal pain. EXAM: ULTRASOUND ABDOMEN LIMITED RIGHT UPPER QUADRANT COMPARISON:  None. FINDINGS: Gallbladder: No gallstones or wall thickening visualized. No sonographic Murphy sign noted by sonographer. Common bile duct: Diameter: 5 mm Liver: Diffuse increased echogenicity with slightly heterogeneous liver. Appearance typically secondary to fatty infiltration. Fibrosis secondary consideration. No secondary findings of cirrhosis noted. No focal hepatic lesion or intrahepatic biliary duct dilatation. Portal vein is patent on color Doppler imaging with normal direction of blood flow towards the liver. Other: None. IMPRESSION: 1. No acute sonographic abnormality. No evidence for cholelithiasis or acute cholecystitis. 2. Hepatic steatosis. Electronically Signed   By: Katherine Mantle M.D.   On: 12/04/2018 23:57      Subjective: - no chest pain, shortness of breath, no abdominal pain, nausea or vomiting.   Discharge Exam: BP 131/75 (BP Location: Left Arm)   Pulse 60   Temp 97.9 F (36.6 C) (Oral)   Resp 20   Ht 6' (1.829 m)   Wt 100 kg   SpO2 96%   BMI 29.90 kg/m   General: Pt is alert, awake, not in acute distress Cardiovascular: RRR, S1/S2 +, no rubs, no gallops Respiratory: CTA bilaterally, no wheezing, no rhonchi Abdominal: Soft, NT, ND, bowel sounds + Extremities: no edema, no cyanosis    The results of significant diagnostics from this  hospitalization (including imaging, microbiology, ancillary and laboratory) are listed below for reference.     Microbiology: Recent Results (from the past 240 hour(s))  Blood Culture (routine x 2)     Status: None   Collection Time: 11/29/18  5:26 AM   Specimen: BLOOD  Result Value Ref Range Status   Specimen Description   Final    BLOOD LEFT ANTECUBITAL Performed at [facility], [address] [place], [place] [address]    Special Requests   Final    BOTTLES DRAWN AEROBIC AND ANAEROBIC Blood Culture results may not be optimal due to an excessive volume of blood received in culture bottles Performed at [facility], [address] [place], [place] [address]    Culture   Final    NO GROWTH 5 DAYS Performed at [facility], [address] [place], [place] [address]    Report Status 12/04/2018 FINAL  Final  Blood Culture (routine x 2)     Status: None   Collection Time: 11/29/18  5:26 AM   Specimen: BLOOD  Result  Value Ref Range Status   Specimen Description   Final    BLOOD RIGHT ANTECUBITAL Performed at [facility], [address] [place], [place] [address]    Special Requests   Final    BOTTLES DRAWN AEROBIC AND ANAEROBIC Blood Culture adequate volume Performed at [facility], [address] [place], [place] [address]    Culture   Final    NO GROWTH 5 DAYS Performed at [facility], [address] [place], [place] [address]    Report Status 12/04/2018 FINAL  Final  Urine culture     Status: None   Collection Time: 11/29/18  5:26 AM   Specimen: In/Out Cath Urine  Result Value Ref Range Status   Specimen Description   Final    IN/OUT CATH URINE Performed at [facility], [address] [place], [place] [address]    Special Requests   Final    NONE Performed at [facility], [address] [place], [place] [address]    Culture    Final    NO GROWTH Performed at [facility], [address] [place], [place] [address]    Report Status 11/30/2018 FINAL  Final  Culture, blood (Routine X 2) w Reflex to ID Panel     Status: None (Preliminary result)   Collection Time: 12/04/18  9:22 PM   Specimen: BLOOD RIGHT HAND  Result Value Ref Range Status   Specimen Description BLOOD RIGHT HAND  Final   Special Requests   Final    BOTTLES DRAWN AEROBIC AND ANAEROBIC Blood Culture adequate volume   Culture   Final    NO GROWTH 4 DAYS Performed at [facility], [address] [place], [place] [address]    Report Status PENDING  Incomplete  Culture, blood (Routine X 2) w Reflex to ID Panel     Status: None (Preliminary result)   Collection Time: 12/04/18  9:22 PM   Specimen: BLOOD  Result Value Ref Range Status   Specimen Description BLOOD LEFT ANTECUBITAL  Final   Special Requests   Final    BOTTLES DRAWN AEROBIC AND ANAEROBIC Blood Culture adequate volume   Culture   Final    NO GROWTH 4 DAYS Performed at [facility], [address] [place], [place] [address]    Report Status PENDING  Incomplete     Labs: Basic Metabolic Panel: Recent Labs  Lab 12/04/18 1840 12/06/18 0404 12/07/18 0230  NA 140 140 140  K 3.4* 3.8 4.0  CL 100 106 106  CO2 24 24 24   GLUCOSE 94 132* 134*  BUN 14 19 23*  CREATININE 1.19 0.84 0.96  CALCIUM 8.6* 8.4* 8.7*  MG  --   --  2.2   Liver Function Tests: Recent Labs  Lab 12/04/18 1840 12/06/18 0404 12/07/18 0230  AST 83* 85* 82*  ALT 70* 82* 94*  ALKPHOS 103 94 97  BILITOT 1.2 1.1 0.8  PROT 6.8 6.7 6.9  ALBUMIN 3.7 3.7 3.7   CBC: Recent Labs  Lab 12/04/18 1840 12/06/18 0404 12/07/18 0230  WBC 7.8 7.4 9.7  NEUTROABS 6.5  --   --   HGB 15.4 14.2 14.3  HCT 43.3 41.2 42.4  MCV 85.9 86.6 87.4  PLT 250 309 356   CBG: No results for input(s): GLUCAP in the last 168 hours. Hgb A1c Recent Labs    12/05/18 1600  HGBA1C 5.3  Lipid Profile No  results for input(s): CHOL, HDL, LDLCALC, TRIG, CHOLHDL, LDLDIRECT in the last 72 hours. Thyroid function studies No results for input(s): TSH, T4TOTAL, T3FREE, THYROIDAB in the last 72 hours.  Invalid input(s): FREET3 Urinalysis    Component Value Date/Time   COLORURINE YELLOW 11/29/2018 0526   APPEARANCEUR CLEAR 11/29/2018 0526   LABSPEC 1.010 11/29/2018 0526   PHURINE 5.0 11/29/2018 0526   GLUCOSEU NEGATIVE 11/29/2018 0526   HGBUR NEGATIVE 11/29/2018 0526   BILIRUBINUR NEGATIVE 11/29/2018 0526   KETONESUR NEGATIVE 11/29/2018 0526   PROTEINUR NEGATIVE 11/29/2018 0526   NITRITE NEGATIVE 11/29/2018 0526   LEUKOCYTESUR NEGATIVE 11/29/2018 0526    FURTHER DISCHARGE INSTRUCTIONS:   Get Medicines reviewed and adjusted: Please take all your medications with you for your next visit with your Primary MD   Laboratory/radiological data: Please request your Primary MD to go over all hospital tests and procedure/radiological results at the follow up, please ask your Primary MD to get all Hospital records sent to his/her office.   In some cases, they will be blood work, cultures and biopsy results pending at the time of your discharge. Please request that your primary care M.D. goes through all the records of your hospital data and follows up on these results.   Also Note the following: If you experience worsening of your admission symptoms, develop shortness of breath, life threatening emergency, suicidal or homicidal thoughts you must seek medical attention immediately by calling 911 or calling your MD immediately  if symptoms less severe.   You must read complete instructions/literature along with all the possible adverse reactions/side effects for all the Medicines you take and that have been prescribed to you. Take any new Medicines after you have completely understood and accpet all the possible adverse reactions/side effects.    Do not drive when taking Pain medications or sleeping  medications (Benzodaizepines)   Do not take more than prescribed Pain, Sleep and Anxiety Medications. It is not advisable to combine anxiety,sleep and pain medications without talking with your primary care practitioner   Special Instructions: If you have smoked or chewed Tobacco  in the last 2 yrs please stop smoking, stop any regular Alcohol  and or any Recreational drug use.   Wear Seat belts while driving.   Please note: You were cared for by a hospitalist during your hospital stay. Once you are discharged, your primary care physician will handle any further medical issues. Please note that NO REFILLS for any discharge medications will be authorized once you are discharged, as it is imperative that you return to your primary care physician (or establish a relationship with a primary care physician if you do not have one) for your post hospital discharge needs so that they can reassess your need for medications and monitor your lab values.  Time coordinating discharge: 40 minutes  SIGNED:  Pamella Pert, MD, PhD 12/08/2018, 1:10 PM

## 2018-12-08 NOTE — Discharge Instructions (Signed)
Follow with PCP in 2 weeks  Please get a complete blood count and chemistry panel checked by your Primary MD at your next visit, and again as instructed by your Primary MD. Please get your medications reviewed and adjusted by your Primary MD.  Please request your Primary MD to go over all Hospital Tests and Procedure/Radiological results at the follow up, please get all Hospital records sent to your Prim MD by signing hospital release before you go home.  In some cases, there will be blood work, cultures and biopsy results pending at the time of your discharge. Please request that your primary care M.D. goes through all the records of your hospital data and follows up on these results.  If you had Pneumonia of Lung problems at the Hospital: Please get a 2 view Chest X ray done in 6-8 weeks after hospital discharge or sooner if instructed by your Primary MD.  If you have Congestive Heart Failure: Please call your Cardiologist or Primary MD anytime you have any of the following symptoms:  1) 3 pound weight gain in 24 hours or 5 pounds in 1 week  2) shortness of breath, with or without a dry hacking cough  3) swelling in the hands, feet or stomach  4) if you have to sleep on extra pillows at night in order to breathe  Follow cardiac low salt diet and 1.5 lit/day fluid restriction.  If you have diabetes Accuchecks 4 times/day, Once in AM empty stomach and then before each meal. Log in all results and show them to your primary doctor at your next visit. If any glucose reading is under 80 or above 300 call your primary MD immediately.  If you have Seizure/Convulsions/Epilepsy: Please do not drive, operate heavy machinery, participate in activities at heights or participate in high speed sports until you have seen by Primary MD or a Neurologist and advised to do so again. [place] [facility] [place], patients with seizures are not allowed to drive until they have been seizure-free for six  months.  Use caution when using heavy equipment or power tools. Avoid working on ladders or at heights. Take showers instead of baths. Ensure the water temperature is not too high on the home water heater. Do not go swimming alone. Do not lock yourself in a room alone (i.e. bathroom). When caring for infants or small children, sit down when holding, feeding, or changing them to minimize risk of injury to the child in the event you have a seizure. Maintain good sleep hygiene. Avoid alcohol.   If you had Gastrointestinal Bleeding: Please ask your Primary MD to check a complete blood count within one week of discharge or at your next visit. Your endoscopic/colonoscopic biopsies that are pending at the time of discharge, will also need to followed by your Primary MD.  Get Medicines reviewed and adjusted. Please take all your medications with you for your next visit with your Primary MD  Please request your Primary MD to go over all hospital tests and procedure/radiological results at the follow up, please ask your Primary MD to get all Hospital records sent to his/her office.  If you experience worsening of your admission symptoms, develop shortness of breath, life threatening emergency, suicidal or homicidal thoughts you must seek medical attention immediately by calling 911 or calling your MD immediately  if symptoms less severe.  You must read complete instructions/literature along with all the possible adverse reactions/side effects for all the Medicines you take and that  have been prescribed to you. Take any new Medicines after you have completely understood and accpet all the possible adverse reactions/side effects.   Do not drive or operate heavy machinery when taking Pain medications.   Do not take more than prescribed Pain, Sleep and Anxiety Medications  Special Instructions: If you have smoked or chewed Tobacco  in the last 2 yrs please stop smoking, stop any regular Alcohol  and or any  Recreational drug use.  Wear Seat belts while driving.  Please note You were cared for by a hospitalist during your hospital stay. If you have any questions about your discharge medications or the care you received while you were in the hospital after you are discharged, you can call the unit and asked to speak with the hospitalist on call if the hospitalist that took care of you is not available. Once you are discharged, your primary care physician will handle any further medical issues. Please note that NO REFILLS for any discharge medications will be authorized once you are discharged, as it is imperative that you return to your primary care physician (or establish a relationship with a primary care physician if you do not have one) for your aftercare needs so that they can reassess your need for medications and monitor your lab values.  You can reach the hospitalist office at phone [phone] or fax [phone]   If you do not have a primary care physician, you can call [phone] for a physician referral.  Activity: As tolerated with Full fall precautions use walker/cane & assistance as needed    Diet: regular  Disposition Home   People who have recovered from the coronavirus, can help a person still fighting the virus and potentially help them recover by giving them convalescent plasma.  What is COVID-19 convalescent plasma? Convalescent plasma (CPP) is plasma collected from people who have recovered from the coronavirus. People who recover from coronavirus infection have developed antibodies to the virus that remain in the plasma portion of their blood. Transfusing the plasma that contains the antibodies into a person still fighting the virus can provide a boost to the patients immune system and potentially help them recover. The experimental treatment is approved by the FDA to be used on an emergency basis and is called COVID-19 convalescent plasma." Critically ill patients who meet  the FDA criteria to receive this therapy can be treated for life-threatening COVID-19.  Can I donate convalescent plasma or blood after being diagnosed with COVID-19? Donors must meet all the required screening criteria for blood donation and the additional FDA criteria, as follows:   Prior diagnosis of COVID-19 documented by an FDA approved laboratory test   A positive diagnostic test at the time of illness OR   A positive serological test for SARS-CoV-2 antibodies after recovery  Complete resolution of symptoms at least 14 days prior to donation and a documented negative COVID-19 FDA approved test OR   Complete resolution of symptoms at least 28 days prior to donation As a part of your pre-donation process you will be required to provide your COVID-19 test result(s).  Where can I donate COVID-19 convalescent plasma? You can complete the pre-donation form at [email or web address] and a donor specialist will be in contact within 24-48 hours.  What is the process for donating COVID-19 Convalescent Plasma?  1. The first step in the donation process is to obtain a copy of your test results or a letter from the testing facility notifying you of your positive result and  the date it was taken 2. Complete the pre-donation form at [email or web address] 3. A representative will be in contact within 24-48 hours to provide additional insight into the process 4. Once the process begins, [facility] will ensure you   Meet the required screening criteria for blood donation   Have the required test results   Meet the criteria for resolution of your symptoms   Complete resolution of symptoms at least 14 days prior to donation and a documented negative COVID-19 FDA approved test OR   Complete resolution of symptoms at least 28 days prior to donation 5. [facility] will then schedule a collection date and time to collect your COVID-19 convalescent plasma  Is the COVID-19 convalescent plasma donation  safe? It is safe to donate COVID-19 convalescent plasma and done in the same way that thousands of blood donors donate blood products every day.  My family member or friend is being treated for COVID-19. Can I donate to help them if I am eligible? A directed donation will need to be arranged in conjunction with your family members care provider.  The care provider will determine whether COVID-19 convalescent plasma is the best course of treatment   A directed donation must be arranged through the Clinician Referral page that can be found at [email or web address]   The clinician will need to provide [facility] with information about both you and the patient for which the donation is directed   Please note there are additional qualifications that are required such as your blood type and the patients blood type We encourage you to consider beginning the pre-registration process even if your loved one will not be receiving COVID-19 convalescent plasma as a part of their medical treatment. The need for COVID-19 convalescent plasma increases daily and your donated COVID-19 convalescent plasma could be life-saving for another COVID-19 patient.  Please go to the website [email or web address] if you would like to consider volunteer for plasma donation.      COVID-19 COVID-19 is a respiratory infection that is caused by a virus called severe acute respiratory syndrome coronavirus 2 (SARS-CoV-2). The disease is also known as coronavirus disease or novel coronavirus. In some people, the virus may not cause any symptoms. In others, it may cause a serious infection. The infection can get worse quickly and can lead to complications, such as:  Pneumonia, or infection of the lungs.  Acute respiratory distress syndrome or ARDS. This is fluid build-up in the lungs.  Acute respiratory failure. This is a condition in which there is not enough oxygen passing from the lungs to the body.  Sepsis or septic  shock. This is a serious bodily reaction to an infection.  Blood clotting problems.  Secondary infections due to bacteria or fungus. The virus that causes COVID-19 is contagious. This means that it can spread from person to person through droplets from coughs and sneezes (respiratory secretions). What are the causes? This illness is caused by a virus. You may catch the virus by:  Breathing in droplets from an infected person's cough or sneeze.  Touching something, like a table or a doorknob, that was exposed to the virus (contaminated) and then touching your mouth, nose, or eyes. What increases the risk? Risk for infection You are more likely to be infected with this virus if you:  Live in or travel to an area with a COVID-19 outbreak.  Come in contact with a sick person who recently traveled to an area with a COVID-19 outbreak.  Provide care for or live  with a person who is infected with COVID-19. Risk for serious illness You are more likely to become seriously ill from the virus if you:  Are 23 years of age or older.  Have a long-term disease that lowers your body's ability to fight infection (immunocompromised).  Live in a nursing home or long-term care facility.  Have a long-term (chronic) disease such as: ? Chronic lung disease, including chronic obstructive pulmonary disease or asthma ? Heart disease. ? Diabetes. ? Chronic kidney disease. ? Liver disease.  Are obese. What are the signs or symptoms? Symptoms of this condition can range from mild to severe. Symptoms may appear any time from 2 to 14 days after being exposed to the virus. They include:  A fever.  A cough.  Difficulty breathing.  Chills.  Muscle pains.  A sore throat.  Loss of taste or smell. Some people may also have stomach problems, such as nausea, vomiting, or diarrhea. Other people may not have any symptoms of COVID-19. How is this diagnosed? This condition may be diagnosed based  on:  Your signs and symptoms, especially if: ? You live in an area with a COVID-19 outbreak. ? You recently traveled to or from an area where the virus is common. ? You provide care for or live with a person who was diagnosed with COVID-19.  A physical exam.  Lab tests, which may include: ? A nasal swab to take a sample of fluid from your nose. ? A throat swab to take a sample of fluid from your throat. ? A sample of mucus from your lungs (sputum). ? Blood tests.  Imaging tests, which may include, X-rays, CT scan, or ultrasound. How is this treated? At present, there is no medicine to treat COVID-19. Medicines that treat other diseases are being used on a trial basis to see if they are effective against COVID-19. Your health care provider will talk with you about ways to treat your symptoms. For most people, the infection is mild and can be managed at home with rest, fluids, and over-the-counter medicines. Treatment for a serious infection usually takes places in a hospital intensive care unit (ICU). It may include one or more of the following treatments. These treatments are given until your symptoms improve.  Receiving fluids and medicines through an IV.  Supplemental oxygen. Extra oxygen is given through a tube in the nose, a face mask, or a hood.  Positioning you to lie on your stomach (prone position). This makes it easier for oxygen to get into the lungs.  Continuous positive airway pressure (CPAP) or bi-level positive airway pressure (BPAP) machine. This treatment uses mild air pressure to keep the airways open. A tube that is connected to a motor delivers oxygen to the body.  Ventilator. This treatment moves air into and out of the lungs by using a tube that is placed in your windpipe.  Tracheostomy. This is a procedure to create a hole in the neck so that a breathing tube can be inserted.  Extracorporeal membrane oxygenation (ECMO). This procedure gives the lungs a chance to  recover by taking over the functions of the heart and lungs. It supplies oxygen to the body and removes carbon dioxide. Follow these instructions at home: Lifestyle  If you are sick, stay home except to get medical care. Your health care provider will tell you how long to stay home. Call your health care provider before you go for medical care.  Rest at home as told by your health  care provider.  Do not use any products that contain nicotine or tobacco, such as cigarettes, e-cigarettes, and chewing tobacco. If you need help quitting, ask your health care provider.  Return to your normal activities as told by your health care provider. Ask your health care provider what activities are safe for you. General instructions  Take over-the-counter and prescription medicines only as told by your health care provider.  Drink enough fluid to keep your urine pale yellow.  Keep all follow-up visits as told by your health care provider. This is important. How is this prevented?  There is no vaccine to help prevent COVID-19 infection. However, there are steps you can take to protect yourself and others from this virus. To protect yourself:   Do not travel to areas where COVID-19 is a risk. The areas where COVID-19 is reported change often. To identify high-risk areas and travel restrictions, check the CDC travel website: StageSync.si  If you live in, or must travel to, an area where COVID-19 is a risk, take precautions to avoid infection. ? Stay away from people who are sick. ? Wash your hands often with soap and water for 20 seconds. If soap and water are not available, use an alcohol-based hand sanitizer. ? Avoid touching your mouth, face, eyes, or nose. ? Avoid going out in public, follow guidance from your state and local health authorities. ? If you must go out in public, wear a cloth face covering or face mask. ? Disinfect objects and surfaces that are frequently touched every  day. This may include:  Counters and tables.  Doorknobs and light switches.  Sinks and faucets.  Electronics, such as phones, remote controls, keyboards, computers, and tablets. To protect others: If you have symptoms of COVID-19, take steps to prevent the virus from spreading to others.  If you think you have a COVID-19 infection, contact your health care provider right away. Tell your health care team that you think you may have a COVID-19 infection.  Stay home. Leave your house only to seek medical care. Do not use public transport.  Do not travel while you are sick.  Wash your hands often with soap and water for 20 seconds. If soap and water are not available, use alcohol-based hand sanitizer.  Stay away from other members of your household. Let healthy household members care for children and pets, if possible. If you have to care for children or pets, wash your hands often and wear a mask. If possible, stay in your own room, separate from others. Use a different bathroom.  Make sure that all people in your household wash their hands well and often.  Cough or sneeze into a tissue or your sleeve or elbow. Do not cough or sneeze into your hand or into the air.  Wear a cloth face covering or face mask. Where to find more information  Centers for Disease Control and Prevention: StickerEmporium.tn  World Health Organization: https://thompson-craig.com/ Contact a health care provider if:  You live in or have traveled to an area where COVID-19 is a risk and you have symptoms of the infection.  You have had contact with someone who has COVID-19 and you have symptoms of the infection. Get help right away if:  You have trouble breathing.  You have pain or pressure in your chest.  You have confusion.  You have bluish lips and fingernails.  You have difficulty waking from sleep.  You have symptoms that get worse. These symptoms may  represent a  serious problem that is an emergency. Do not wait to see if the symptoms will go away. Get medical help right away. Call your local emergency services (911 in the [place] Do not drive yourself to the hospital. Let the emergency medical personnel know if you think you have COVID-19. Summary  COVID-19 is a respiratory infection that is caused by a virus. It is also known as coronavirus disease or novel coronavirus. It can cause serious infections, such as pneumonia, acute respiratory distress syndrome, acute respiratory failure, or sepsis.  The virus that causes COVID-19 is contagious. This means that it can spread from person to person through droplets from coughs and sneezes.  You are more likely to develop a serious illness if you are 51 years of age or older, have a weak immunity, live in a nursing home, or have chronic disease.  There is no medicine to treat COVID-19. Your health care provider will talk with you about ways to treat your symptoms.  Take steps to protect yourself and others from infection. Wash your hands often and disinfect objects and surfaces that are frequently touched every day. Stay away from people who are sick and wear a mask if you are sick. This information is not intended to replace advice given to you by your health care provider. Make sure you discuss any questions you have with your health care provider. Document Released: 01/29/2018 Document Revised: 05/21/2018 Document Reviewed: 01/29/2018 Elsevier Patient Education  2020 Elsevier Inc.  COVID-19: How to Protect Yourself and Others Know how it spreads  There is currently no vaccine to prevent coronavirus disease 2019 (COVID-19).  The best way to prevent illness is to avoid being exposed to this virus.  The virus is thought to spread mainly from person-to-person. ? Between people who are in close contact with one another (within about 6 feet). ? Through respiratory droplets produced when an  infected person coughs, sneezes or talks. ? These droplets can land in the mouths or noses of people who are nearby or possibly be inhaled into the lungs. ? Some recent studies have suggested that COVID-19 may be spread by people who are not showing symptoms. Everyone should Clean your hands often  Wash your hands often with soap and water for at least 20 seconds especially after you have been in a public place, or after blowing your nose, coughing, or sneezing.  If soap and water are not readily available, use a hand sanitizer that contains at least 60% alcohol. Cover all surfaces of your hands and rub them together until they feel dry.  Avoid touching your eyes, nose, and mouth with unwashed hands. Avoid close contact  Stay home if you are sick.  Avoid close contact with people who are sick.  Put distance between yourself and other people. ? Remember that some people without symptoms may be able to spread virus. ? This is especially important for people who are at higher risk of getting very RetroStamps.it Cover your mouth and nose with a cloth face cover when around others  You could spread COVID-19 to others even if you do not feel sick.  Everyone should wear a cloth face cover when they have to go out in public, for example to the grocery store or to pick up other necessities. ? Cloth face coverings should not be placed on young children under age 44, anyone who has trouble breathing, or is unconscious, incapacitated or otherwise unable to remove the mask without assistance.  The cloth face cover  is meant to protect other people in case you are infected.  Do NOT use a facemask meant for a Research scientist (physical sciences).  Continue to keep about 6 feet between yourself and others. The cloth face cover is not a substitute for social distancing. Cover coughs and sneezes  If you are in a private setting and do not have on  your cloth face covering, remember to always cover your mouth and nose with a tissue when you cough or sneeze or use the inside of your elbow.  Throw used tissues in the trash.  Immediately wash your hands with soap and water for at least 20 seconds. If soap and water are not readily available, clean your hands with a hand sanitizer that contains at least 60% alcohol. Clean and disinfect  Clean AND disinfect frequently touched surfaces daily. This includes tables, doorknobs, light switches, countertops, handles, desks, phones, keyboards, toilets, faucets, and sinks. ktimeonline.com  If surfaces are dirty, clean them: Use detergent or soap and water prior to disinfection.  Then, use a household disinfectant. You can see a list of EPA-registered household disinfectants here. SouthAmericaFlowers.co.uk 05/12/2018 This information is not intended to replace advice given to you by your health care provider. Make sure you discuss any questions you have with your health care provider. Document Released: 04/21/2018 Document Revised: 05/20/2018 Document Reviewed: 04/21/2018 Elsevier Patient Education  2020 [employer].   COVID-19 Frequently Asked Questions COVID-19 (coronavirus disease) is an infection that is caused by a large family of viruses. Some viruses cause illness in people and others cause illness in animals like camels, cats, and bats. In some cases, the viruses that cause illness in animals can spread to humans. Where did the coronavirus come from? In December 2019, [place] told the [employer] [facility]) of several cases of lung disease (human respiratory illness). These cases were linked to an open seafood and livestock market in the city of [place]. The link to the seafood and livestock market suggests that the virus may have spread from animals to humans. However, since that first outbreak in December, the virus has also  been shown to spread from person to person. What is the name of the disease and the virus? Disease name Early on, this disease was called novel coronavirus. This is because scientists determined that the disease was caused by a new (novel) respiratory virus. The World Health Organization [facility]) has now named the disease COVID-19, or coronavirus disease. Virus name The virus that causes the disease is called severe acute respiratory syndrome coronavirus 2 (SARS-CoV-2). More information on disease and virus naming World Health Organization [facility]): www.who.int/emergencies/diseases/novel-coronavirus-2019/technical-guidance/naming-the-coronavirus-disease-(covid-2019)-and-the-virus-that-causes-it Who is at risk for complications from coronavirus disease? Some people may be at higher risk for complications from coronavirus disease. This includes older adults and people who have chronic diseases, such as heart disease, diabetes, and lung disease. If you are at higher risk for complications, take these extra precautions:  Avoid close contact with people who are sick or have a fever or cough. Stay at least 3-6 ft (1-2 m) away from them, if possible.  Wash your hands often with soap and water for at least 20 seconds.  Avoid touching your face, mouth, nose, or eyes.  Keep supplies on hand at home, such as food, medicine, and cleaning supplies.  Stay home as much as possible.  Avoid social gatherings and travel. How does coronavirus disease spread? The virus that causes coronavirus disease spreads easily from person to person (is contagious). There are also cases of  community-spread disease. This means the disease has spread to:  People who have no known contact with other infected people.  People who have not traveled to areas where there are known cases. It appears to spread from one person to another through droplets from coughing or sneezing. Can I get the virus from touching surfaces or  objects? There is still a lot that we do not know about the virus that causes coronavirus disease. Scientists are basing a lot of information on what they know about similar viruses, such as:  Viruses cannot generally survive on surfaces for long. They need a human body (host) to survive.  It is more likely that the virus is spread by close contact with people who are sick (direct contact), such as through: ? Shaking hands or hugging. ? Breathing in respiratory droplets that travel through the air. This can happen when an infected person coughs or sneezes on or near other people.  It is less likely that the virus is spread when a person touches a surface or object that has the virus on it (indirect contact). The virus may be able to enter the body if the person touches a surface or object and then touches his or her face, eyes, nose, or mouth. Can a person spread the virus without having symptoms of the disease? It may be possible for the virus to spread before a person has symptoms of the disease, but this is most likely not the main way the virus is spreading. It is more likely for the virus to spread by being in close contact with people who are sick and breathing in the respiratory droplets of a sick person's cough or sneeze. What are the symptoms of coronavirus disease? Symptoms vary from person to person and can range from mild to severe. Symptoms may include:  Fever.  Cough.  Tiredness, weakness, or fatigue.  Fast breathing or feeling short of breath. These symptoms can appear anywhere from 2 to 14 days after you have been exposed to the virus. If you develop symptoms, call your health care provider. People with severe symptoms may need hospital care. If I am exposed to the virus, how long does it take before symptoms start? Symptoms of coronavirus disease may appear anywhere from 2 to 14 days after a person has been exposed to the virus. If you develop symptoms, call your health care  provider. Should I be tested for this virus? Your health care provider will decide whether to test you based on your symptoms, history of exposure, and your risk factors. How does a health care provider test for this virus? Health care providers will collect samples to send for testing. Samples may include:  Taking a swab of fluid from the nose.  Taking fluid from the lungs by having you cough up mucus (sputum) into a sterile cup.  Taking a blood sample.  Taking a stool or urine sample. Is there a treatment or vaccine for this virus? Currently, there is no vaccine to prevent coronavirus disease. Also, there are no medicines like antibiotics or antivirals to treat the virus. A person who becomes sick is given supportive care, which means rest and fluids. A person may also relieve his or her symptoms by using over-the-counter medicines that treat sneezing, coughing, and runny nose. These are the same medicines that a person takes for the common cold. If you develop symptoms, call your health care provider. People with severe symptoms may need hospital care. What can I do to  protect myself and my family from this virus?     You can protect yourself and your family by taking the same actions that you would take to prevent the spread of other viruses. Take the following actions:  Wash your hands often with soap and water for at least 20 seconds. If soap and water are not available, use alcohol-based hand sanitizer.  Avoid touching your face, mouth, nose, or eyes.  Cough or sneeze into a tissue, sleeve, or elbow. Do not cough or sneeze into your hand or the air. ? If you cough or sneeze into a tissue, throw it away immediately and wash your hands.  Disinfect objects and surfaces that you frequently touch every day.  Avoid close contact with people who are sick or have a fever or cough. Stay at least 3-6 ft (1-2 m) away from them, if possible.  Stay home if you are sick, except to get  medical care. Call your health care provider before you get medical care.  Make sure your vaccines are up to date. Ask your health care provider what vaccines you need. What should I do if I need to travel? Follow travel recommendations from your local health authority, the CDC, and WHO. Travel information and advice  Centers for Disease Control and Prevention (CDC): GeminiCard.gl  World Health Organization [facility]): PreviewDomains.se Know the risks and take action to protect your health  You are at higher risk of getting coronavirus disease if you are traveling to areas with an outbreak or if you are exposed to travelers from areas with an outbreak.  Wash your hands often and practice good hygiene to lower the risk of catching or spreading the virus. What should I do if I am sick? General instructions to stop the spread of infection  Wash your hands often with soap and water for at least 20 seconds. If soap and water are not available, use alcohol-based hand sanitizer.  Cough or sneeze into a tissue, sleeve, or elbow. Do not cough or sneeze into your hand or the air.  If you cough or sneeze into a tissue, throw it away immediately and wash your hands.  Stay home unless you must get medical care. Call your health care provider or local health authority before you get medical care.  Avoid public areas. Do not take public transportation, if possible.  If you can, wear a mask if you must go out of the house or if you are in close contact with someone who is not sick. Keep your home clean  Disinfect objects and surfaces that are frequently touched every day. This may include: ? Counters and tables. ? Doorknobs and light switches. ? Sinks and faucets. ? Electronics such as phones, remote controls, keyboards, computers, and tablets.  Wash dishes in hot, soapy water or use a dishwasher. Air-dry  your dishes.  Wash laundry in hot water. Prevent infecting other household members  Let healthy household members care for children and pets, if possible. If you have to care for children or pets, wash your hands often and wear a mask.  Sleep in a different bedroom or bed, if possible.  Do not share personal items, such as razors, toothbrushes, deodorant, combs, brushes, towels, and washcloths. Where to find more information Centers for Disease Control and Prevention (CDC)  Information and news updates: CardRetirement.cz World Health Organization [facility])  Information and news updates: AffordableSalon.es  Coronavirus health topic: https://thompson-craig.com/  Questions and answers on COVID-19: kruiseway.com  Global tracker: who.sprinklr.com American Academy  of Pediatrics (AAP)  Information for families: www.healthychildren.org/English/health-issues/conditions/chest-lungs/Pages/2019-Novel-Coronavirus.aspx The coronavirus situation is changing rapidly. Check your local health authority website or the CDC and [facility] websites for updates and news. When should I contact a health care provider?  Contact your health care provider if you have symptoms of an infection, such as fever or cough, and you: ? Have been near anyone who is known to have coronavirus disease. ? Have come into contact with a person who is suspected to have coronavirus disease. ? Have traveled outside of the country. When should I get emergency medical care?  Get help right away by calling your local emergency services (911 in the [place] if you have: ? Trouble breathing. ? Pain or pressure in your chest. ? Confusion. ? Blue-tinged lips and fingernails. ? Difficulty waking from sleep. ? Symptoms that get worse. Let the emergency medical personnel know if you think you have coronavirus disease. Summary  A new  respiratory virus is spreading from person to person and causing COVID-19 (coronavirus disease).  The virus that causes COVID-19 appears to spread easily. It spreads from one person to another through droplets from coughing or sneezing.  Older adults and those with chronic diseases are at higher risk of disease. If you are at higher risk for complications, take extra precautions.  There is currently no vaccine to prevent coronavirus disease. There are no medicines, such as antibiotics or antivirals, to treat the virus.  You can protect yourself and your family by washing your hands often, avoiding touching your face, and covering your coughs and sneezes. This information is not intended to replace advice given to you by your health care provider. Make sure you discuss any questions you have with your health care provider. Document Released: 04/21/2018 Document Revised: 04/21/2018 Document Reviewed: 04/21/2018 Elsevier Patient Education  2020 Elsevier Inc.  Prevent the Spread of COVID-19 if You Are Sick If you are sick with COVID-19 or think you might have COVID-19, follow the steps below to help protect other people in your home and community. Stay home except to get medical care.  Stay home. Most people with COVID-19 have mild illness and are able to recover at home without medical care. Do not leave your home, except to get medical care. Do not visit public areas.  Take care of yourself. Get rest and stay hydrated.  Get medical care when needed. Call your doctor before you go to their office for care. But, if you have trouble breathing or other concerning symptoms, call 911 for immediate help.  Avoid public transportation, ride-sharing, or taxis. Separate yourself from other people and pets in your home.  As much as possible, stay in a specific room and away from other people and pets in your home. Also, you should use a separate bathroom, if available. If you need to be around other  people or animals in or outside of the home, wear a cloth face covering. ? See COVID-19 and Animals if you have questions about pets: RebateDates.com.br Monitor your symptoms.  Common symptoms of COVID-19 include fever and cough. Trouble breathing is a more serious symptom that means you should get medical attention.  Follow care instructions from your healthcare provider and local health department. Your local health authorities will give instructions on checking your symptoms and reporting information. If you develop emergency warning signs for COVID-19 get medical attention immediately.  Emergency warning signs include*:  Trouble breathing  Persistent pain or pressure in the chest  New confusion or not able  to be woken  Bluish lips or face *This list is not all inclusive. Please consult your medical provider for any other symptoms that are severe or concerning to you. Call 911 if you have a medical emergency. If you have a medical emergency and need to call 911, notify the operator that you have or think you might have, COVID-19. If possible, put on a facemask before medical help arrives. Call ahead before visiting your doctor.  Call ahead. Many medical visits for routine care are being postponed or done by phone or telemedicine.  If you have a medical appointment that cannot be postponed, call your doctor's office. This will help the office protect themselves and other patients. If you are sick, wear a cloth covering over your nose and mouth.  You should wear a cloth face covering over your nose and mouth if you must be around other people or animals, including pets (even at home).  You don't need to wear the cloth face covering if you are alone. If you can't put on a cloth face covering (because of trouble breathing for example), cover your coughs and sneezes in some other way. Try to stay at least 6 feet away from other people. This  will help protect the people around you. Note: During the COVID-19 pandemic, medical grade facemasks are reserved for healthcare workers and some first responders. You may need to make a cloth face covering using a scarf or bandana. Cover your coughs and sneezes.  Cover your mouth and nose with a tissue when you cough or sneeze.  Throw used tissues in a lined trash can.  Immediately wash your hands with soap and water for at least 20 seconds. If soap and water are not available, clean your hands with an alcohol-based hand sanitizer that contains at least 60% alcohol. Clean your hands often.  Wash your hands often with soap and water for at least 20 seconds. This is especially important after blowing your nose, coughing, or sneezing; going to the bathroom; and before eating or preparing food.  Use hand sanitizer if soap and water are not available. Use an alcohol-based hand sanitizer with at least 60% alcohol, covering all surfaces of your hands and rubbing them together until they feel dry.  Soap and water are the best option, especially if your hands are visibly dirty.  Avoid touching your eyes, nose, and mouth with unwashed hands. Avoid sharing personal household items.  Do not share dishes, drinking glasses, cups, eating utensils, towels, or bedding with other people in your home.  Wash these items thoroughly after using them with soap and water or put them in the dishwasher. Clean all "high-touch" surfaces everyday.  Clean and disinfect high-touch surfaces in your "sick room" and bathroom. Let someone else clean and disinfect surfaces in common areas, but not your bedroom and bathroom.  If a caregiver or other person needs to clean and disinfect a sick person's bedroom or bathroom, they should do so on an as-needed basis. The caregiver/other person should wear a mask and wait as long as possible after the sick person has used the bathroom. High-touch surfaces include phones, remote  controls, counters, tabletops, doorknobs, bathroom fixtures, toilets, keyboards, tablets, and bedside tables.  Clean and disinfect areas that may have blood, stool, or body fluids on them.  Use household cleaners and disinfectants. Clean the area or item with soap and water or another detergent if it is dirty. Then use a household disinfectant. ? Be sure to follow the  instructions on the label to ensure safe and effective use of the product. Many products recommend keeping the surface wet for several minutes to ensure germs are killed. Many also recommend precautions such as wearing gloves and making sure you have good ventilation during use of the product. ? Most EPA-registered household disinfectants should be effective. How to discontinue home isolation  People with COVID-19 who have stayed home (home isolated) can stop home isolation under the following conditions: ? If you will not have a test to determine if you are still contagious, you can leave home after these three things have happened:  You have had no fever for at least 72 hours (that is three full days of no fever without the use of medicine that reduces fevers) AND  other symptoms have improved (for example, when your cough or shortness of breath has improved) AND  at least 10 days have passed since your symptoms first appeared. ? If you will be tested to determine if you are still contagious, you can leave home after these three things have happened:  You no longer have a fever (without the use of medicine that reduces fevers) AND  other symptoms have improved (for example, when your cough or shortness of breath has improved) AND  you received two negative tests in a row, 24 hours apart. Your doctor will follow CDC guidelines. In all cases, follow the guidance of your healthcare provider and local health department. The decision to stop home isolation should be made in consultation with your healthcare provider and state and  local health departments. Local decisions depend on local circumstances. SouthAmericaFlowers.co.uk 05/10/2018 This information is not intended to replace advice given to you by your health care provider. Make sure you discuss any questions you have with your health care provider. Document Released: 04/21/2018 Document Revised: 05/20/2018 Document Reviewed: 04/21/2018 Elsevier Patient Education  2020 [employer].

## 2018-12-08 NOTE — Progress Notes (Signed)
D/c instructions provided to pt. prescriptions given. Pt has no questions at this time. Awaiting for pick up from family.

## 2018-12-09 LAB — CULTURE, BLOOD (ROUTINE X 2)
Culture: NO GROWTH
Special Requests: ADEQUATE

## 2018-12-10 NOTE — Plan of Care (Signed)
Called by microbiology lab, blood cultures 1/2 positive for gram-positive rods.  Microbiology lab will do ID reflex.  Likely contaminant   Ripudeep Rai M.D. [facility] 12/10/2018, 1:48 PM  Pager: [phone]

## 2018-12-11 LAB — CULTURE, BLOOD (ROUTINE X 2): Special Requests: ADEQUATE

## 2018-12-17 DIAGNOSIS — U071 COVID-19: Secondary | ICD-10-CM | POA: Diagnosis not present

## 2018-12-17 DIAGNOSIS — J189 Pneumonia, unspecified organism: Secondary | ICD-10-CM | POA: Diagnosis not present

## 2018-12-17 DIAGNOSIS — R748 Abnormal levels of other serum enzymes: Secondary | ICD-10-CM | POA: Diagnosis not present

## 2018-12-17 DIAGNOSIS — R7989 Other specified abnormal findings of blood chemistry: Secondary | ICD-10-CM | POA: Diagnosis not present

## 2018-12-25 DIAGNOSIS — M25512 Pain in left shoulder: Secondary | ICD-10-CM | POA: Diagnosis not present

## 2018-12-30 ENCOUNTER — Ambulatory Visit
Admission: RE | Admit: 2018-12-30 | Discharge: 2018-12-30 | Disposition: A | Discharge status: Home / Self Care | Payer: BC Managed Care – PPO | Source: Ambulatory Visit | Attending: Family Medicine | Admitting: Family Medicine

## 2018-12-30 ENCOUNTER — Other Ambulatory Visit: Payer: Self-pay | Admitting: Family Medicine

## 2018-12-30 DIAGNOSIS — J189 Pneumonia, unspecified organism: Secondary | ICD-10-CM

## 2018-12-30 DIAGNOSIS — U071 COVID-19: Secondary | ICD-10-CM | POA: Diagnosis not present

## 2018-12-30 DIAGNOSIS — J1289 Other viral pneumonia: Secondary | ICD-10-CM | POA: Diagnosis not present

## 2018-12-30 IMAGING — CR DG CHEST 2V
2 series · 2 of 2 positions shown · non-contrast
Comparison: [DATE]

CLINICAL DATA: Follow-up COVID pneumonia

EXAM:
CHEST - 2 VIEW

[w chest pa]
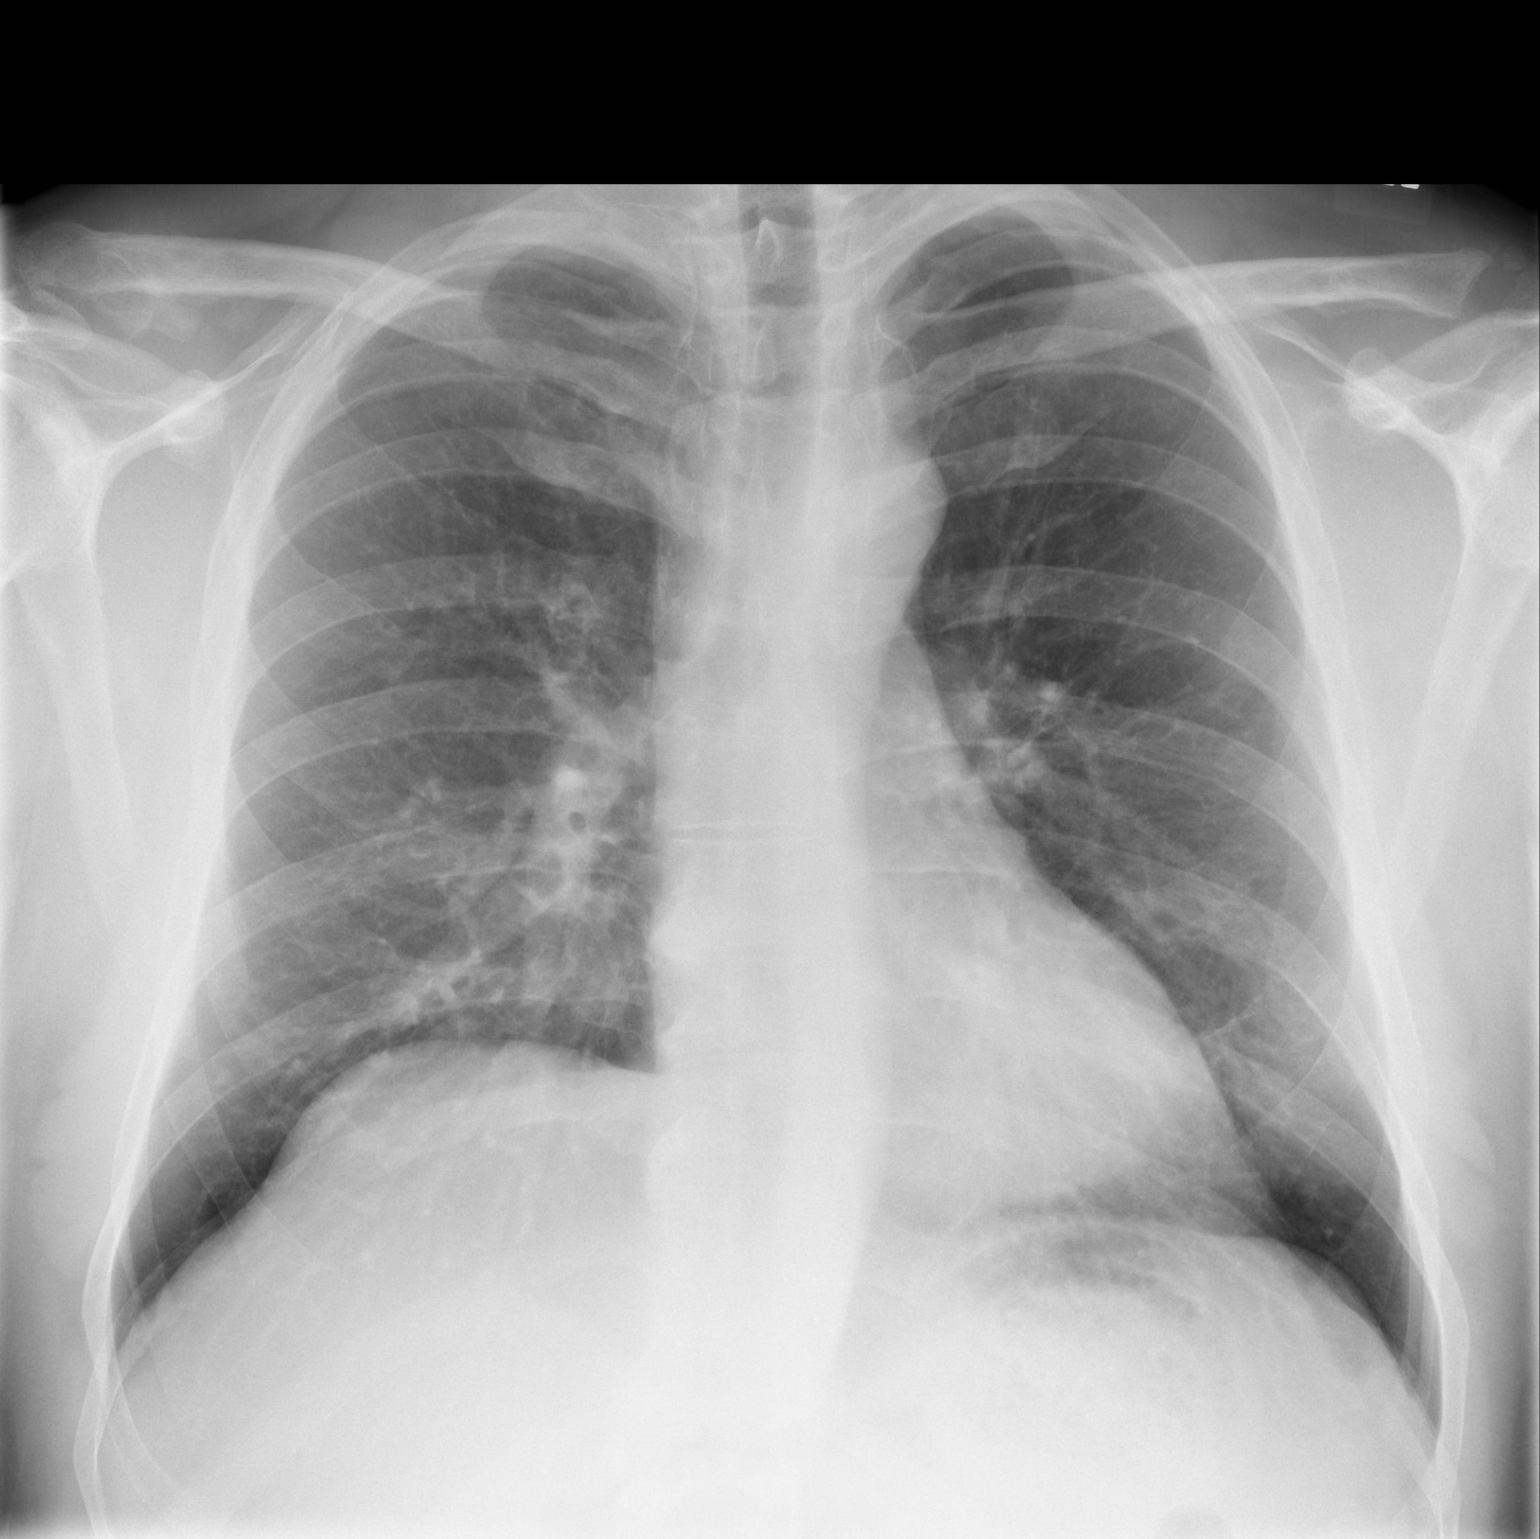

[w chest lat]
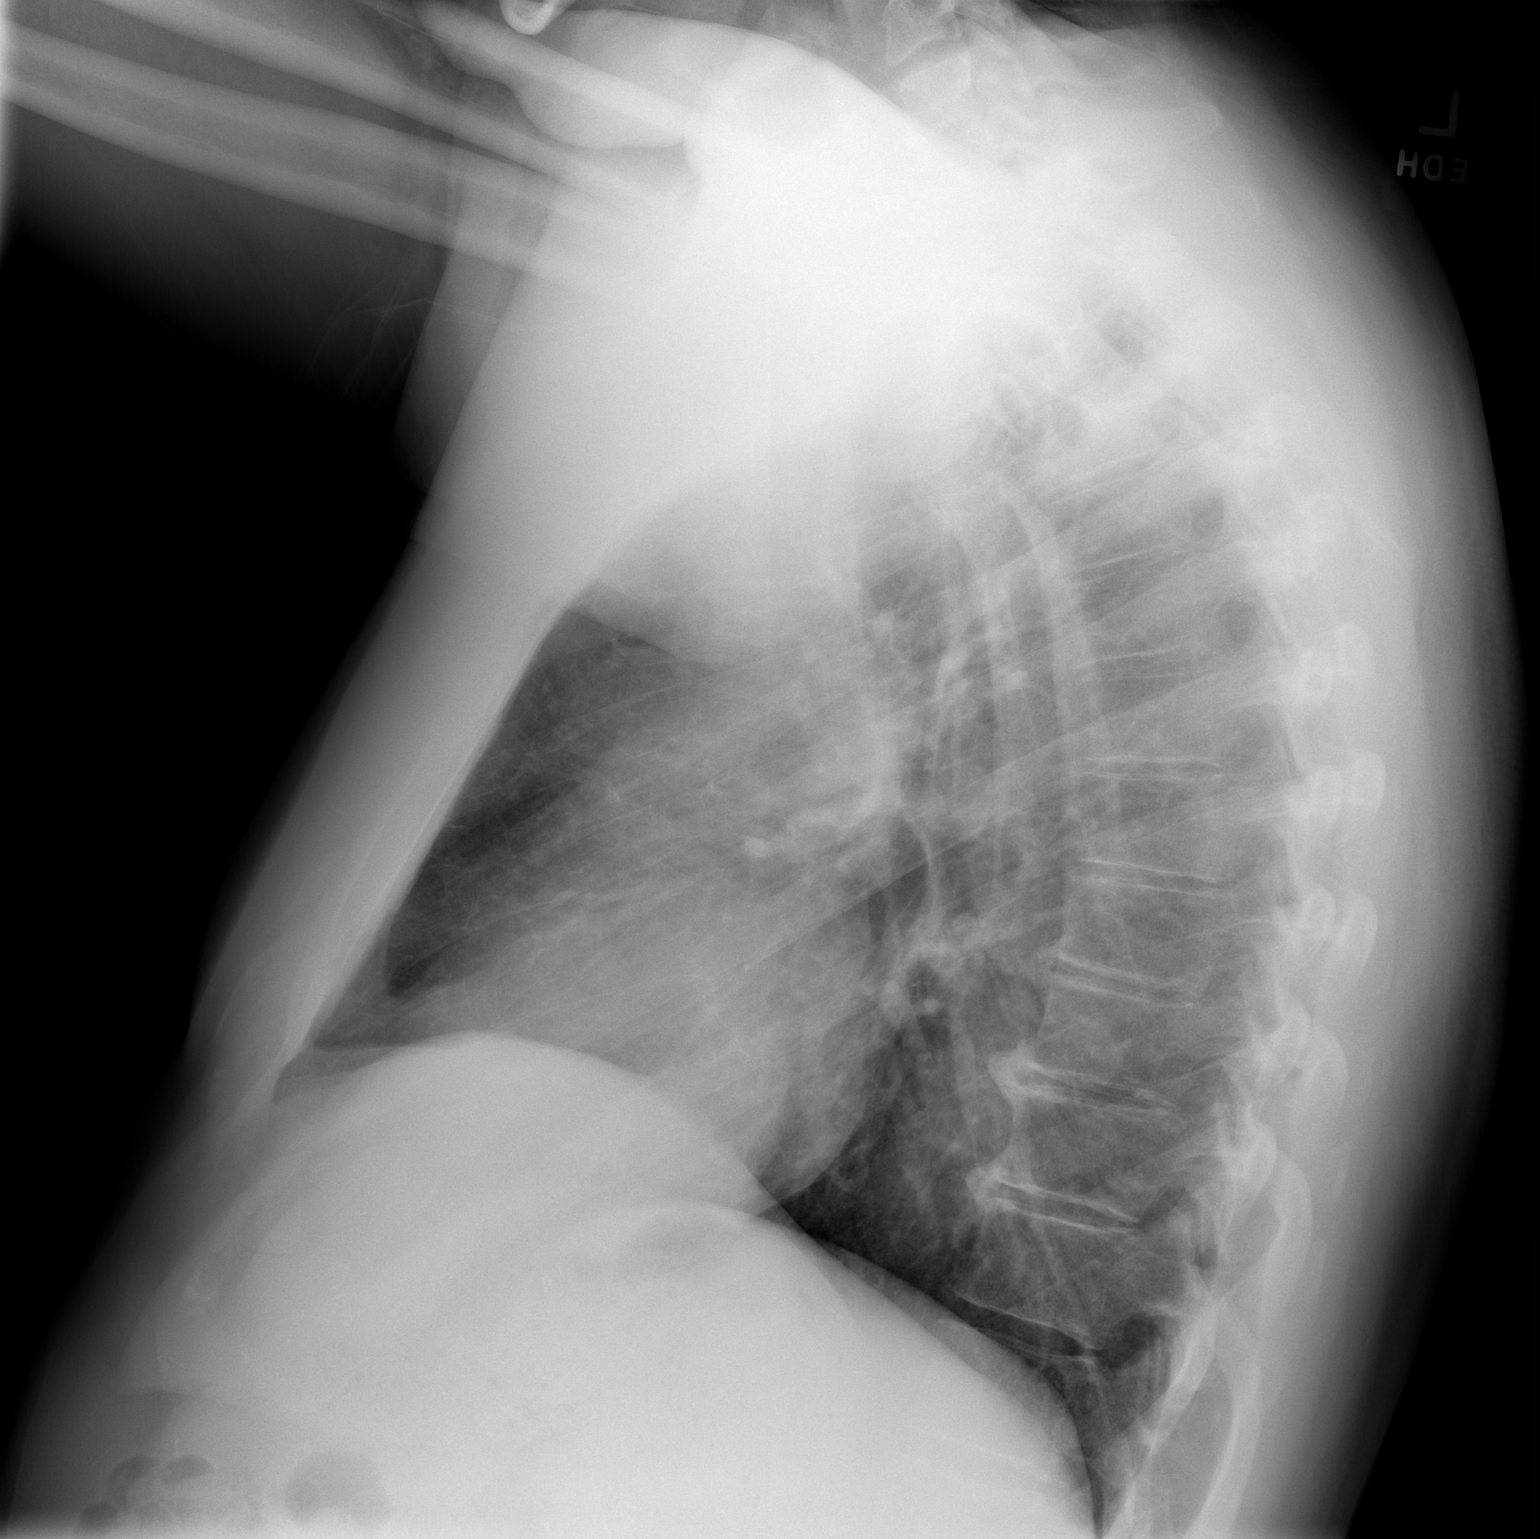

[2 of 2 positions shown; findings below may reference images not displayed]

FINDINGS: Improved aeration in the lungs. Improved lung volumes. No confluent
airspace opacities currently. Heart is normal size. No effusions or
acute bony abnormality.
IMPRESSION: Improving aeration and resolved bilateral airspace disease. No acute
cardiopulmonary disease.

## 2019-01-11 ENCOUNTER — Other Ambulatory Visit: Payer: Self-pay

## 2019-02-03 DIAGNOSIS — M7542 Impingement syndrome of left shoulder: Secondary | ICD-10-CM | POA: Diagnosis not present

## 2019-02-03 DIAGNOSIS — M542 Cervicalgia: Secondary | ICD-10-CM | POA: Diagnosis not present

## 2019-02-18 DIAGNOSIS — Z125 Encounter for screening for malignant neoplasm of prostate: Secondary | ICD-10-CM | POA: Diagnosis not present

## 2019-02-18 DIAGNOSIS — R5383 Other fatigue: Secondary | ICD-10-CM | POA: Diagnosis not present

## 2019-02-18 DIAGNOSIS — E559 Vitamin D deficiency, unspecified: Secondary | ICD-10-CM | POA: Diagnosis not present

## 2019-02-18 DIAGNOSIS — Z0000 Encounter for general adult medical examination without abnormal findings: Secondary | ICD-10-CM | POA: Diagnosis not present

## 2019-03-03 DIAGNOSIS — M542 Cervicalgia: Secondary | ICD-10-CM | POA: Diagnosis not present

## 2019-03-08 DIAGNOSIS — M542 Cervicalgia: Secondary | ICD-10-CM | POA: Diagnosis not present

## 2019-03-08 DIAGNOSIS — M2560 Stiffness of unspecified joint, not elsewhere classified: Secondary | ICD-10-CM | POA: Diagnosis not present

## 2019-03-11 DIAGNOSIS — M542 Cervicalgia: Secondary | ICD-10-CM | POA: Diagnosis not present

## 2019-03-11 DIAGNOSIS — M2560 Stiffness of unspecified joint, not elsewhere classified: Secondary | ICD-10-CM | POA: Diagnosis not present

## 2019-03-15 DIAGNOSIS — M2560 Stiffness of unspecified joint, not elsewhere classified: Secondary | ICD-10-CM | POA: Diagnosis not present

## 2019-03-15 DIAGNOSIS — M542 Cervicalgia: Secondary | ICD-10-CM | POA: Diagnosis not present

## 2019-03-19 DIAGNOSIS — M2560 Stiffness of unspecified joint, not elsewhere classified: Secondary | ICD-10-CM | POA: Diagnosis not present

## 2019-03-19 DIAGNOSIS — M542 Cervicalgia: Secondary | ICD-10-CM | POA: Diagnosis not present

## 2019-03-26 DIAGNOSIS — M542 Cervicalgia: Secondary | ICD-10-CM | POA: Diagnosis not present

## 2019-03-26 DIAGNOSIS — M2560 Stiffness of unspecified joint, not elsewhere classified: Secondary | ICD-10-CM | POA: Diagnosis not present

## 2019-03-31 DIAGNOSIS — M542 Cervicalgia: Secondary | ICD-10-CM | POA: Diagnosis not present

## 2019-04-15 DIAGNOSIS — R0681 Apnea, not elsewhere classified: Secondary | ICD-10-CM | POA: Diagnosis not present

## 2019-04-28 DIAGNOSIS — M542 Cervicalgia: Secondary | ICD-10-CM | POA: Diagnosis not present

## 2019-09-27 DIAGNOSIS — M79672 Pain in left foot: Secondary | ICD-10-CM | POA: Diagnosis not present

## 2019-09-27 DIAGNOSIS — M21622 Bunionette of left foot: Secondary | ICD-10-CM | POA: Diagnosis not present

## 2019-09-27 DIAGNOSIS — M71572 Other bursitis, not elsewhere classified, left ankle and foot: Secondary | ICD-10-CM | POA: Diagnosis not present

## 2019-09-27 DIAGNOSIS — M722 Plantar fascial fibromatosis: Secondary | ICD-10-CM | POA: Diagnosis not present

## 2019-10-13 DIAGNOSIS — M71572 Other bursitis, not elsewhere classified, left ankle and foot: Secondary | ICD-10-CM | POA: Diagnosis not present

## 2019-10-13 DIAGNOSIS — M722 Plantar fascial fibromatosis: Secondary | ICD-10-CM | POA: Diagnosis not present

## 2019-10-14 DIAGNOSIS — F419 Anxiety disorder, unspecified: Secondary | ICD-10-CM | POA: Diagnosis not present

## 2019-10-14 DIAGNOSIS — U099 Post covid-19 condition, unspecified: Secondary | ICD-10-CM | POA: Diagnosis not present

## 2019-11-03 DIAGNOSIS — M71572 Other bursitis, not elsewhere classified, left ankle and foot: Secondary | ICD-10-CM | POA: Diagnosis not present

## 2019-11-03 DIAGNOSIS — M722 Plantar fascial fibromatosis: Secondary | ICD-10-CM | POA: Diagnosis not present

## 2019-11-11 DIAGNOSIS — M654 Radial styloid tenosynovitis [de Quervain]: Secondary | ICD-10-CM | POA: Diagnosis not present

## 2019-11-11 DIAGNOSIS — N411 Chronic prostatitis: Secondary | ICD-10-CM | POA: Diagnosis not present

## 2019-11-17 DIAGNOSIS — M722 Plantar fascial fibromatosis: Secondary | ICD-10-CM | POA: Diagnosis not present

## 2019-11-17 DIAGNOSIS — M71572 Other bursitis, not elsewhere classified, left ankle and foot: Secondary | ICD-10-CM | POA: Diagnosis not present

## 2019-12-01 ENCOUNTER — Other Ambulatory Visit: Payer: Self-pay | Admitting: Family Medicine

## 2019-12-01 DIAGNOSIS — N50811 Right testicular pain: Secondary | ICD-10-CM

## 2019-12-01 DIAGNOSIS — N50812 Left testicular pain: Secondary | ICD-10-CM

## 2019-12-17 ENCOUNTER — Ambulatory Visit
Admission: RE | Admit: 2019-12-17 | Discharge: 2019-12-17 | Disposition: A | Discharge status: Home / Self Care | Payer: BC Managed Care – PPO | Source: Ambulatory Visit | Attending: Family Medicine | Admitting: Family Medicine

## 2019-12-17 DIAGNOSIS — N433 Hydrocele, unspecified: Secondary | ICD-10-CM | POA: Diagnosis not present

## 2019-12-17 DIAGNOSIS — N50811 Right testicular pain: Secondary | ICD-10-CM

## 2019-12-17 DIAGNOSIS — N50812 Left testicular pain: Secondary | ICD-10-CM | POA: Diagnosis not present

## 2019-12-17 IMAGING — US US SCROTUM W/ DOPPLER COMPLETE
1 series · 13 of 25 positions shown · non-contrast
Comparison: None.

CLINICAL DATA: Bilateral testicular pain, left greater than right.
Pain for 1 year, worse recently.

EXAM:
SCROTAL ULTRASOUND
DOPPLER ULTRASOUND OF THE TESTICLES
TECHNIQUE: Complete ultrasound examination of the testicles, epididymis, and
other scrotal structures was performed. Color and spectral Doppler
ultrasound were also utilized to evaluate blood flow to the
testicles.

[Series 1: us scrotum w/ doppler complete · 0.08mm/px · 13 of 67 slices shown]
[im 1/67]
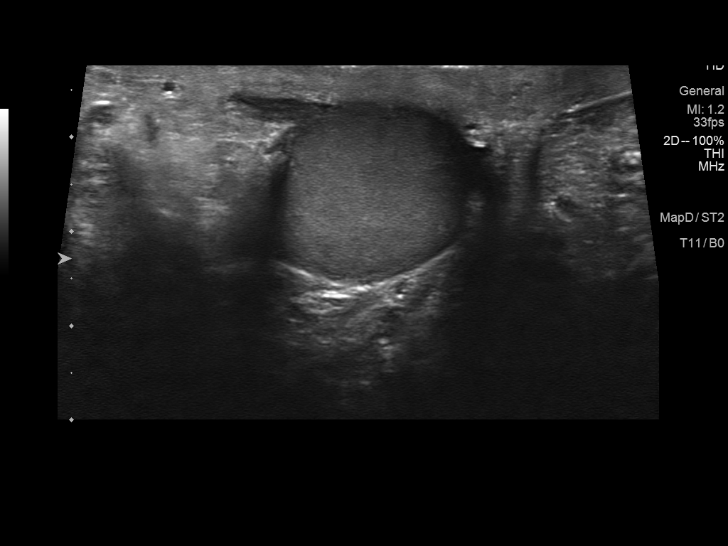
[im 6/67]
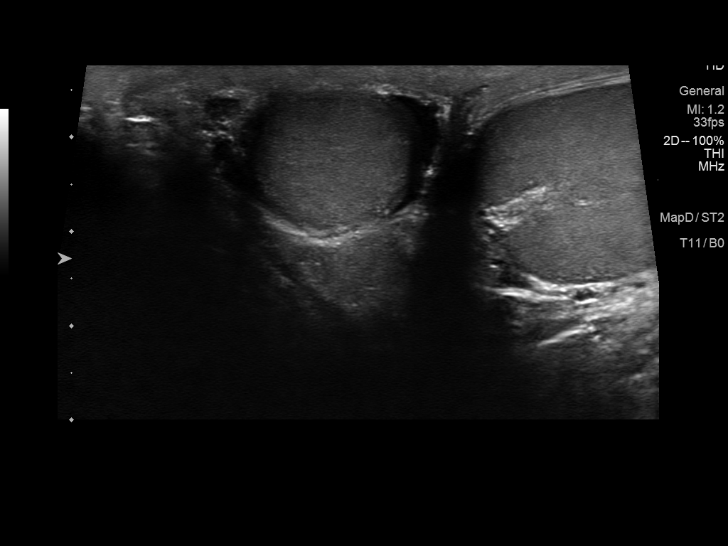
[im 12/67]
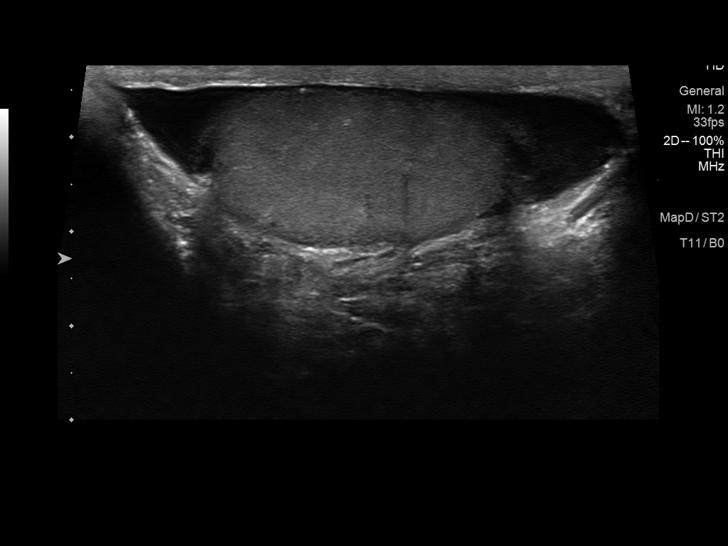
[im 17/67]
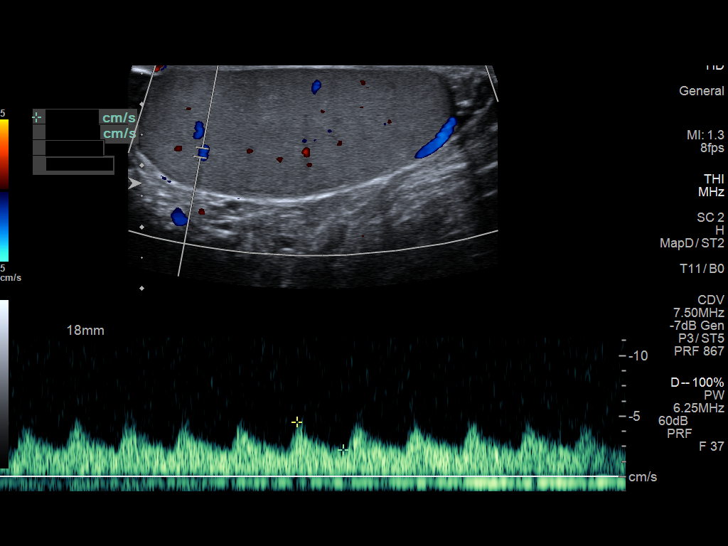
[im 23/67]
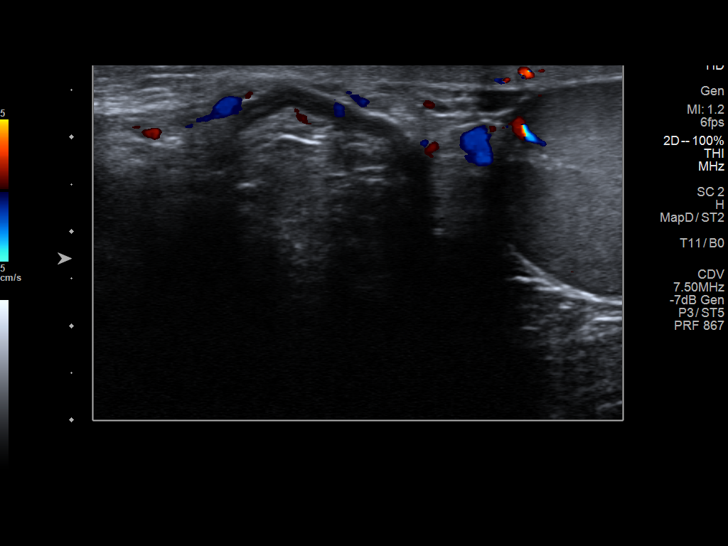
[im 28/67]
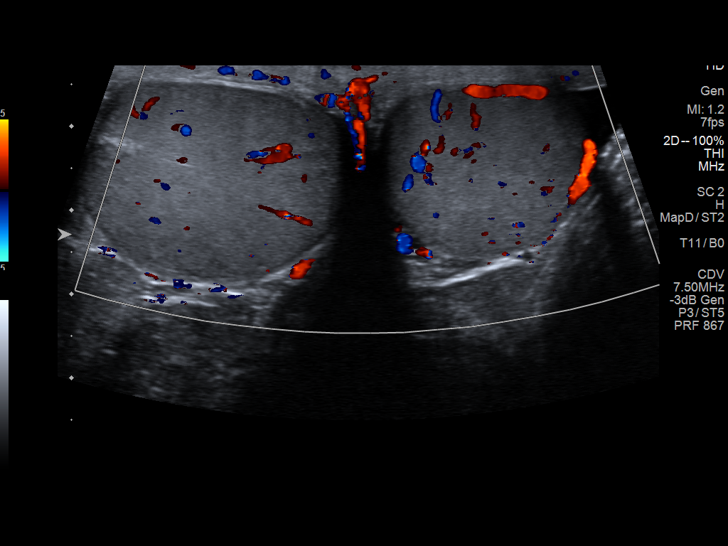
[im 34/67]
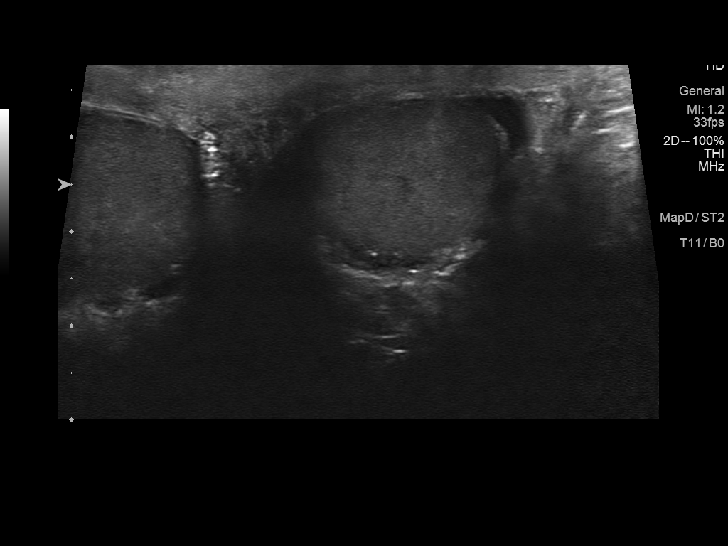
[im 39/67]
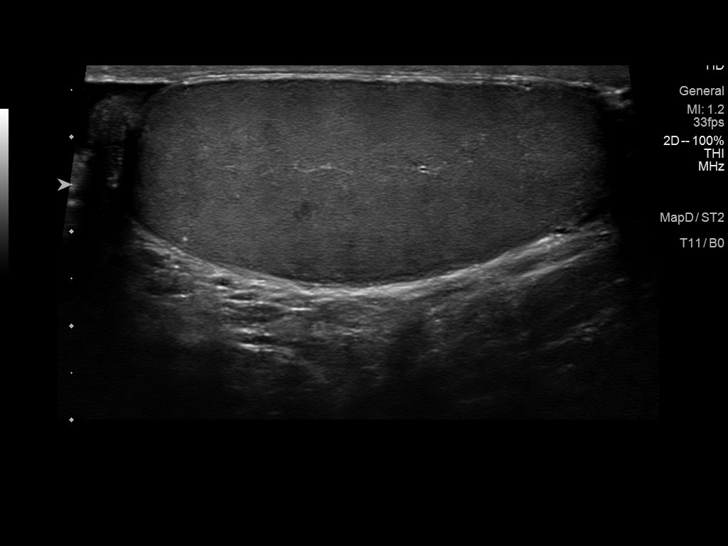
[im 45/67]
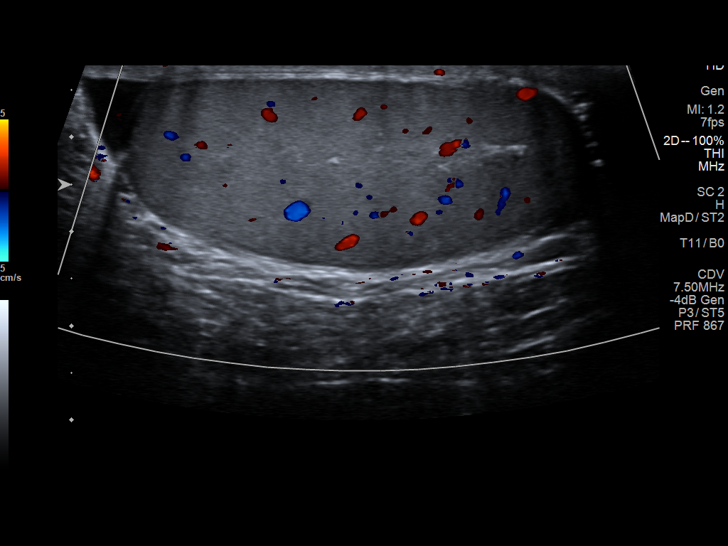
[im 50/67]
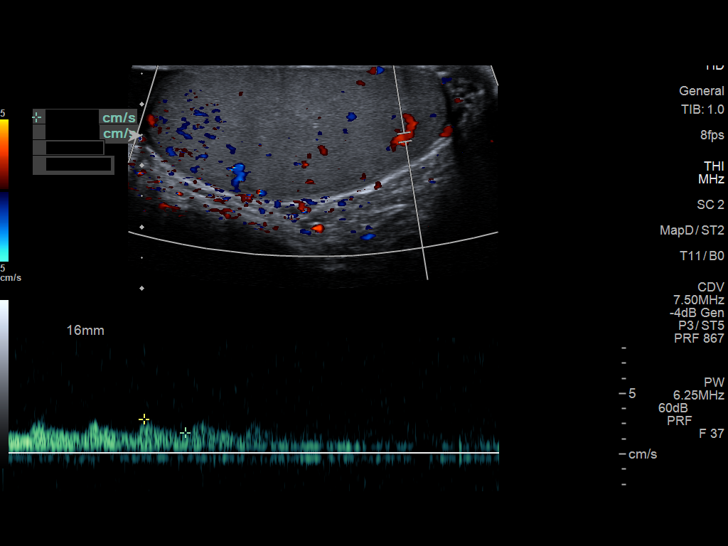
[im 56/67]
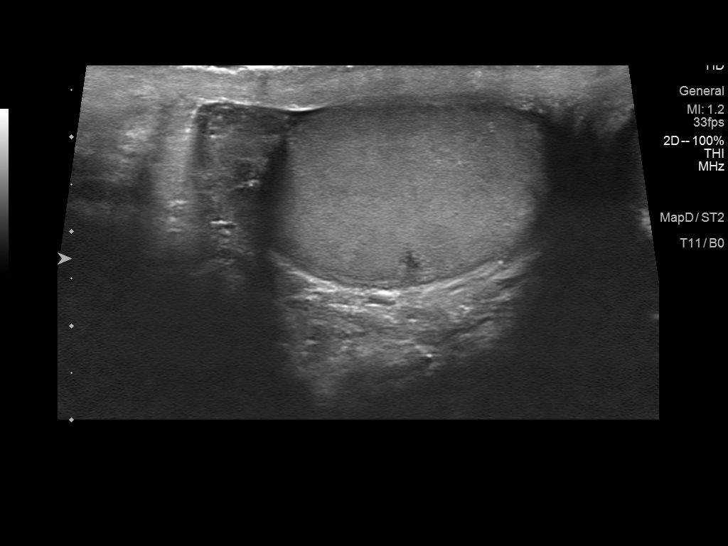
[im 61/67]
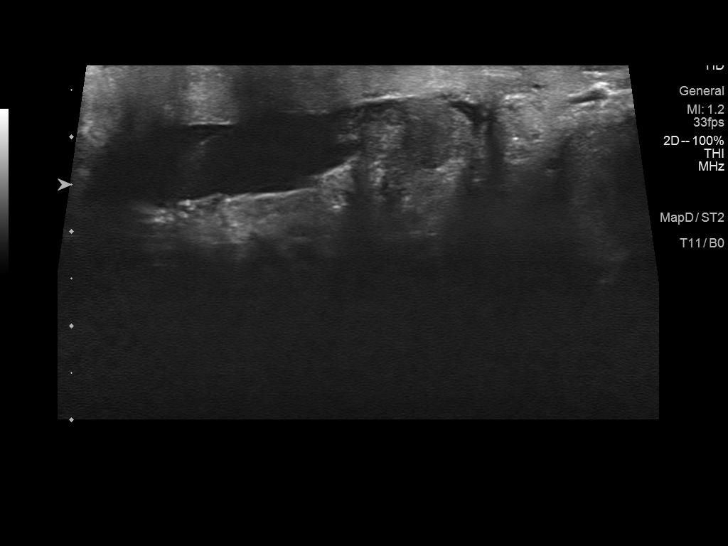
[im 67/67]
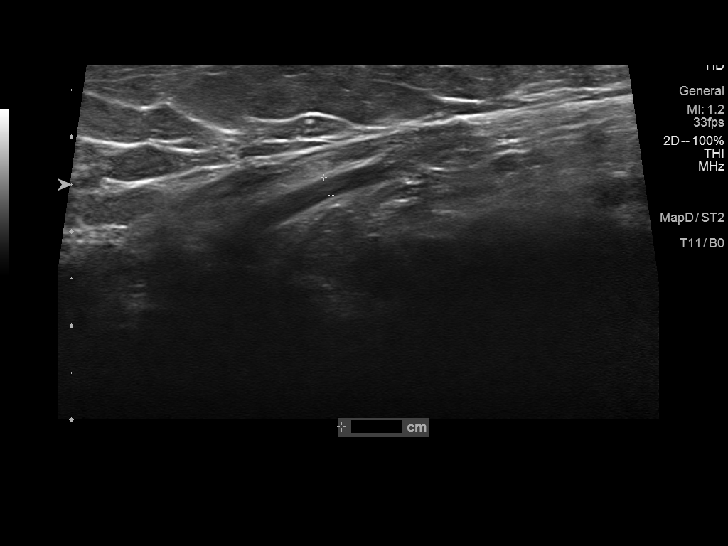

[13 of 25 positions shown; findings below may reference images not displayed]

FINDINGS: Right testicle

Measurements: 4.5 x 2.2 x 3.1 cm. Homogeneous echogenicity with
normal blood flow. No mass. Single punctate parenchymal
calcification.

Left testicle

Measurements: 5.1 x 2.2 x 3.2 cm. Homogeneous echogenicity with
normal blood flow. No mass. Single punctate parenchymal
calcification.

Right epididymis:  Normal in size and appearance.

Left epididymis:  Normal in size and appearance.

Hydrocele:  Small bilateral, right greater than left.

Varicocele:  None visualized.

Pulsed Doppler interrogation of both testes demonstrates normal low
resistance arterial and venous waveforms bilaterally.
IMPRESSION: 1. Small bilateral hydroceles, right greater than left.
2. Single punctate bilateral calcification in the right left testis,
of no clinical significance. Normal blood flow to both testes.

## 2020-02-23 DIAGNOSIS — Z0000 Encounter for general adult medical examination without abnormal findings: Secondary | ICD-10-CM | POA: Diagnosis not present

## 2020-02-23 DIAGNOSIS — E559 Vitamin D deficiency, unspecified: Secondary | ICD-10-CM | POA: Diagnosis not present

## 2020-02-23 DIAGNOSIS — Z13220 Encounter for screening for lipoid disorders: Secondary | ICD-10-CM | POA: Diagnosis not present

## 2020-02-23 DIAGNOSIS — F419 Anxiety disorder, unspecified: Secondary | ICD-10-CM | POA: Diagnosis not present

## 2020-03-23 ENCOUNTER — Other Ambulatory Visit: Payer: Self-pay

## 2020-03-23 ENCOUNTER — Ambulatory Visit (INDEPENDENT_AMBULATORY_CARE_PROVIDER_SITE_OTHER): Payer: BC Managed Care – PPO

## 2020-03-23 ENCOUNTER — Ambulatory Visit: Payer: BC Managed Care – PPO | Admitting: Podiatry

## 2020-03-23 ENCOUNTER — Encounter: Payer: Self-pay | Admitting: Podiatry

## 2020-03-23 DIAGNOSIS — M654 Radial styloid tenosynovitis [de Quervain]: Secondary | ICD-10-CM | POA: Insufficient documentation

## 2020-03-23 DIAGNOSIS — E559 Vitamin D deficiency, unspecified: Secondary | ICD-10-CM | POA: Insufficient documentation

## 2020-03-23 DIAGNOSIS — M722 Plantar fascial fibromatosis: Secondary | ICD-10-CM

## 2020-03-23 DIAGNOSIS — N411 Chronic prostatitis: Secondary | ICD-10-CM | POA: Insufficient documentation

## 2020-03-23 DIAGNOSIS — F419 Anxiety disorder, unspecified: Secondary | ICD-10-CM | POA: Insufficient documentation

## 2020-03-23 MED ORDER — TRIAMCINOLONE ACETONIDE 40 MG/ML IJ SUSP
20.0000 mg | Freq: Once | INTRAMUSCULAR | Status: AC
  Administered 2020-03-23: 20 mg

## 2020-03-23 MED ORDER — MELOXICAM 15 MG PO TABS: 15.0000 mg | ORAL_TABLET | Freq: Every day | ORAL | 3 refills | Status: DC

## 2020-03-23 NOTE — Patient Instructions (Signed)

## 2020-03-23 NOTE — Progress Notes (Signed)
Subjective:  Patient ID: Carl Chang, male    DOB: 01-06-1976,  MRN: [id number] HPI Chief Complaint  Patient presents with  . Foot Pain    Plantar heel and lateral side left - aching since August 2021, AM pain, has worn orthotics x 10 years, saw doc at [facility] few months ago-injected and gave night splint, Ibuprofen PRN, used to be an active runner  . New Patient (Initial Visit)    45 y.o. male presents with the above complaint.   ROS: Denies fever chills nausea vomiting muscle aches pains calf pain back pain chest pain shortness of breath.  Past Medical History:  Diagnosis Date  . Hayfever    Past Surgical History:  Procedure Laterality Date  . SHOULDER SURGERY      Current Outpatient Medications:  .  meloxicam (MOBIC) 15 MG tablet, Take 1 tablet (15 mg total) by mouth daily., Disp: 30 tablet, Rfl: 3 .  VITAMIN D PO, Take by mouth., Disp: , Rfl:  .  fexofenadine (ALLEGRA) 180 MG tablet, Take 180 mg by mouth daily., Disp: , Rfl:  .  Multiple Vitamin (MULTIVITAMIN WITH MINERALS) TABS tablet, Take 1 tablet by mouth daily., Disp: , Rfl:  .  sertraline (ZOLOFT) 100 MG tablet, Take 150 mg by mouth daily., Disp: , Rfl:   Allergies  Allergen Reactions  . Citalopram Hydrobromide     Other reaction(s): Unknown  . Escitalopram Oxalate     Other reaction(s): Unknown  . Prednisone Other (See Comments)    hiccups    Review of Systems Objective:  There were no vitals filed for this visit.  General: Well developed, nourished, in no acute distress, alert and oriented x3   Dermatological: Skin is warm, dry and supple bilateral. Nails x 10 are well maintained; remaining integument appears unremarkable at this time. There are no open sores, no preulcerative lesions, no rash or signs of infection present.  Vascular: Dorsalis Pedis artery and Posterior Tibial artery pedal pulses are 2/4 bilateral with immedate capillary fill time. Pedal hair growth present. No varicosities and no  lower extremity edema present bilateral.   Neruologic: Grossly intact via light touch bilateral. Vibratory intact via tuning fork bilateral. Protective threshold with Semmes Wienstein monofilament intact to all pedal sites bilateral. Patellar and Achilles deep tendon reflexes 2+ bilateral. No Babinski or clonus noted bilateral.   Musculoskeletal: No gross boney pedal deformities bilateral. No pain, crepitus, or limitation noted with foot and ankle range of motion bilateral. Muscular strength 5/5 in all groups tested bilateral.  Pain on palpation medial calcaneal tubercle of the heel.  No pain medial lateral compression of calcaneus he does have tenderness on palpation of the fourth fifth TMT joint.  Gait: Unassisted, Nonantalgic.    Radiographs:  Radiographs taken today demonstrate plantar distally oriented calcaneal heel spur no other acute abnormalities identified.  Assessment & Plan:   Assessment: Plan fasciitis chronic.  Plan: Discussed etiology pathology conservative surgical therapies at this point in time I started him on meloxicam injected his heel 20 mg Kenalog 5 mg Marcaine per minute plantar fascial brace he already has a night splint.  Discussed appropriate shoe gear told him not to wear his orthotics at this point and I would like to follow-up with him in 1 month to see how he is doing if he is not improved may need to consider an MRI since this [place] many times he has been injected by a different podiatrist.     Jeremaih Klima T. [place], [place]

## 2020-04-25 ENCOUNTER — Other Ambulatory Visit: Payer: Self-pay

## 2020-04-25 ENCOUNTER — Ambulatory Visit: Payer: BC Managed Care – PPO | Admitting: Podiatry

## 2020-04-25 DIAGNOSIS — S93692A Other sprain of left foot, initial encounter: Secondary | ICD-10-CM

## 2020-04-25 NOTE — Progress Notes (Signed)
He presents today for follow-up of his Planter fasciitis left foot.  He only had about a 40 to 50% resolution of pain and it has regressed back to nearly 0.  Objective: Vital signs are stable alert oriented x3.  Pulses are palpable.  Moderate to severe pain on palpation medial calcaneal tubercle left heel.  Assessment probable tear of the plantar fascia.  Plan: Requesting MRI.  All conservative therapies from previous doctors have failed to alleviate this patient's symptoms.  MRI is requested because of the chronicity looking for differential diagnosis as well surgical consideration.

## 2020-05-05 ENCOUNTER — Inpatient Hospital Stay: Admission: RE | Admit: 2020-05-05 | Payer: Self-pay | Source: Ambulatory Visit

## 2020-05-08 ENCOUNTER — Ambulatory Visit
Admission: RE | Admit: 2020-05-08 | Discharge: 2020-05-08 | Disposition: A | Discharge status: Home / Self Care | Payer: BC Managed Care – PPO | Source: Ambulatory Visit | Attending: Podiatry | Admitting: Podiatry

## 2020-05-08 ENCOUNTER — Other Ambulatory Visit: Payer: Self-pay

## 2020-05-08 DIAGNOSIS — M722 Plantar fascial fibromatosis: Secondary | ICD-10-CM | POA: Diagnosis not present

## 2020-05-08 DIAGNOSIS — M65872 Other synovitis and tenosynovitis, left ankle and foot: Secondary | ICD-10-CM | POA: Diagnosis not present

## 2020-05-08 DIAGNOSIS — M7989 Other specified soft tissue disorders: Secondary | ICD-10-CM | POA: Diagnosis not present

## 2020-05-08 DIAGNOSIS — S93692A Other sprain of left foot, initial encounter: Secondary | ICD-10-CM

## 2020-05-08 DIAGNOSIS — S86312A Strain of muscle(s) and tendon(s) of peroneal muscle group at lower leg level, left leg, initial encounter: Secondary | ICD-10-CM | POA: Diagnosis not present

## 2020-05-08 IMAGING — MR MR ANKLE*L* W/O CM
5 series · 38 of 40 positions shown · non-contrast
Comparison: Left foot x-rays dated [DATE].

CLINICAL DATA: Chronic left heel pain.

EXAM:
MRI OF THE LEFT ANKLE WITHOUT CONTRAST
TECHNIQUE: Multiplanar, multisequence MR imaging of the ankle was performed. No
intravenous contrast was administered.

[Series 4: T2 fat-sat · axial · 3.0mm · 0.53mm/px · z∈[-90,+70]mm · 9 of 42 slices shown (1 of 2)]
[im 1/42]
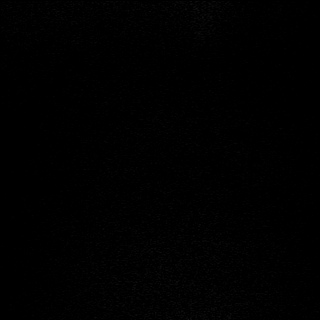
[im 6/42]
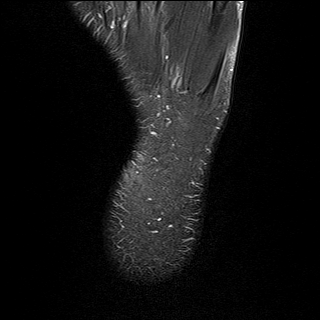
[im 11/42]
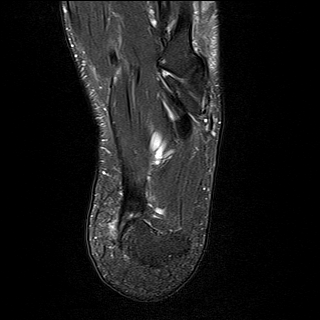
[im 16/42]
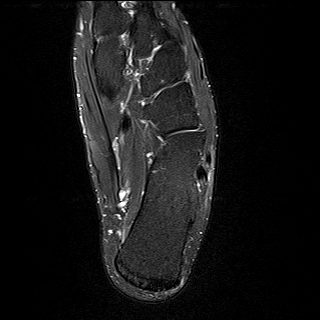
[im 21/42]
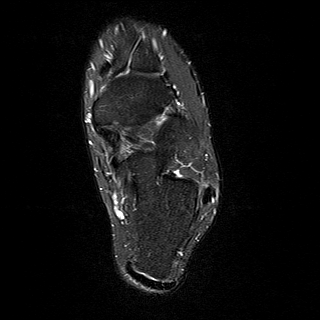
[im 26/42]
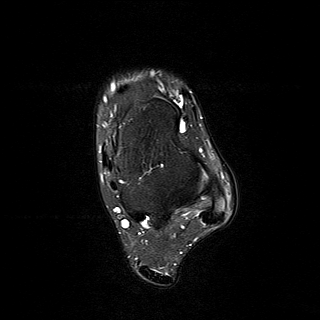
[im 31/42]
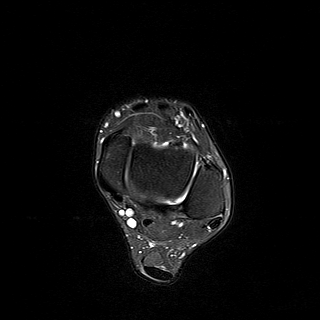
[im 36/42]
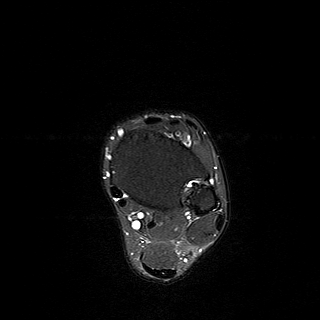
[im 42/42]
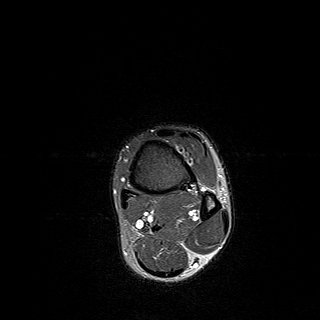

[Series 5: PD fat-sat · axial · 3.0mm · 0.53mm/px · z∈[-90,+70]mm · 8 of 42 slices shown]
[im 1/42]
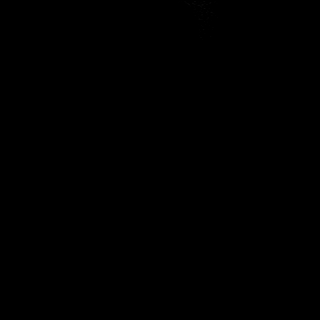
[im 5/42]
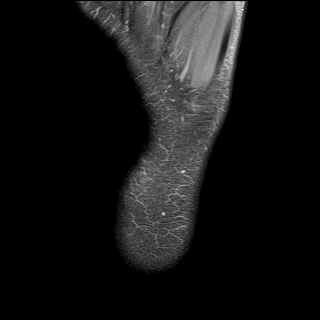
[im 14/42]
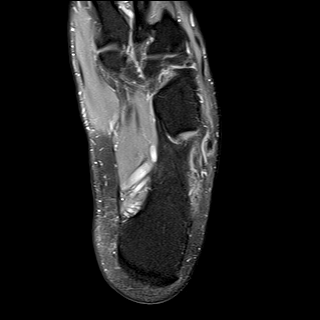
[im 19/42]
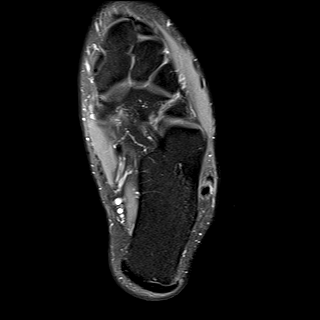
[im 23/42]
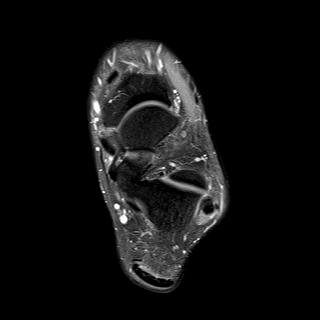
[im 28/42]
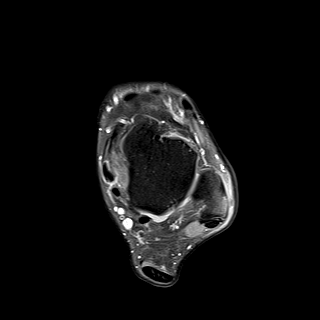
[im 37/42]
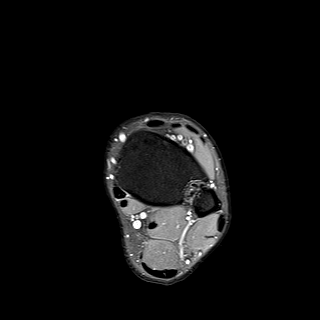
[im 42/42]
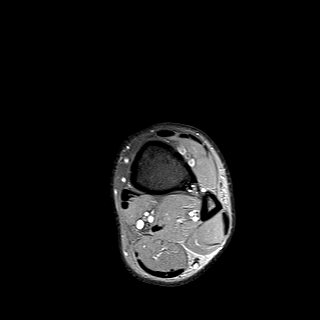

[Series 8: STIR · sagittal · 4.0mm · 0.35mm/px · 6 of 25 slices shown]
[im 1/25]
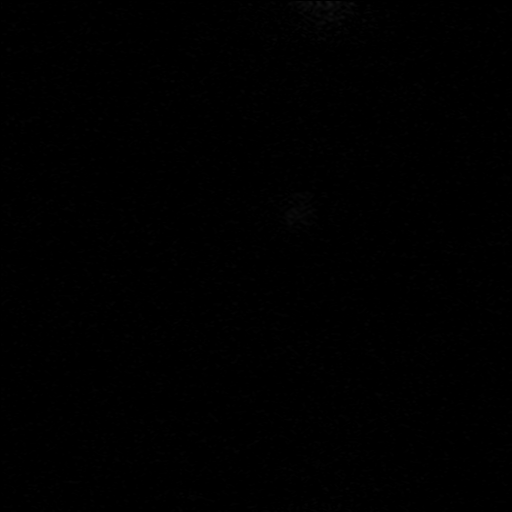
[im 5/25]
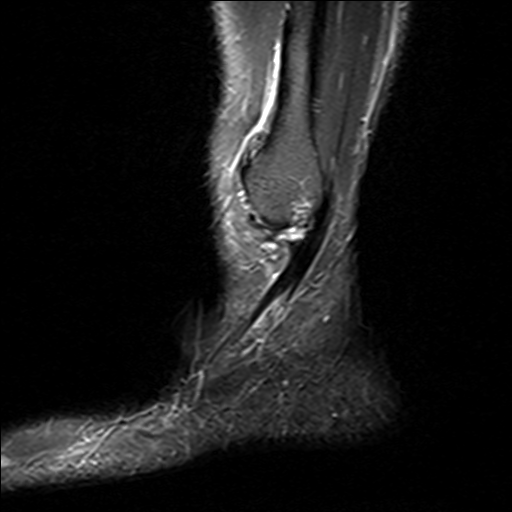
[im 10/25]
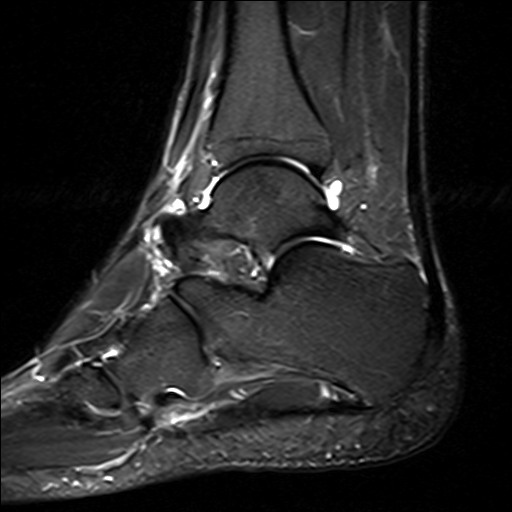
[im 15/25]
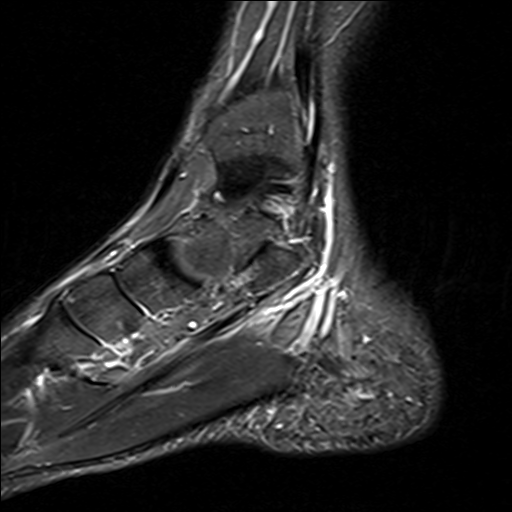
[im 20/25]
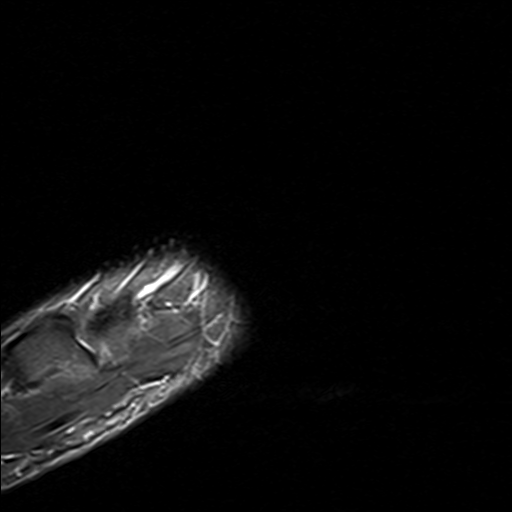
[im 25/25]
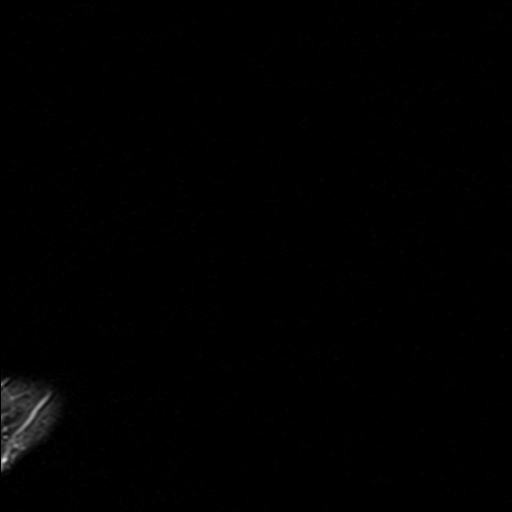

[Series 9: T1 · sagittal · 4.0mm · 0.56mm/px · 6 of 25 slices shown]
[im 1/25]
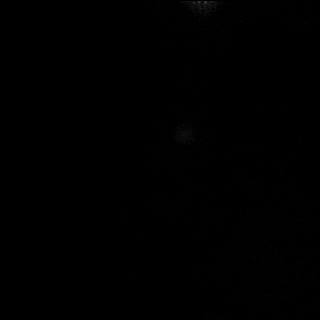
[im 5/25]
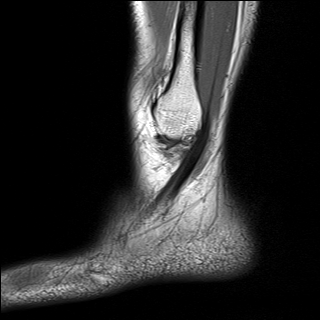
[im 10/25]
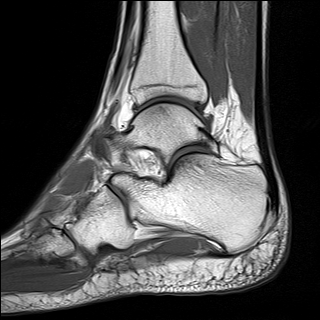
[im 15/25]
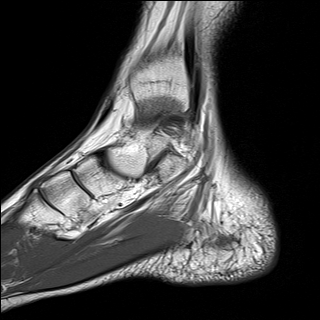
[im 20/25]
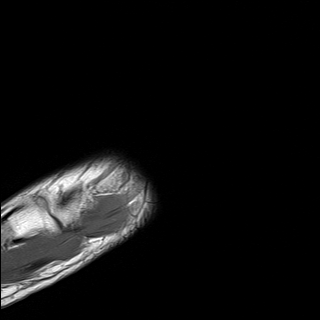
[im 25/25]
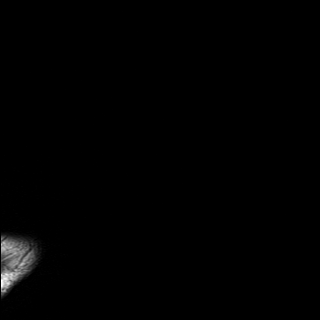

[Series 10: T2 fat-sat · coronal · 3.0mm · 0.53mm/px · 9 of 41 slices shown (2 of 2)]
[im 1/41]
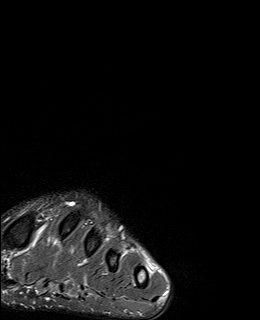
[im 6/41]
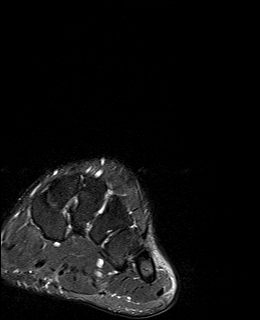
[im 11/41]
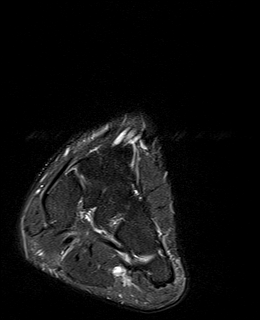
[im 16/41]
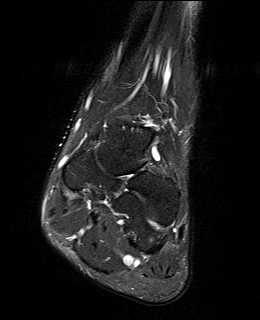
[im 21/41]
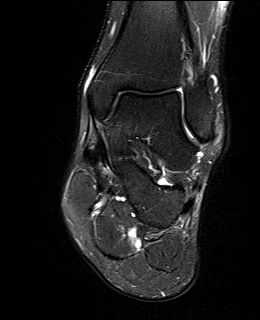
[im 26/41]
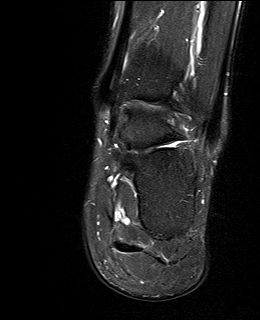
[im 31/41]
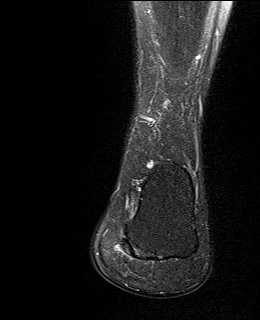
[im 36/41]
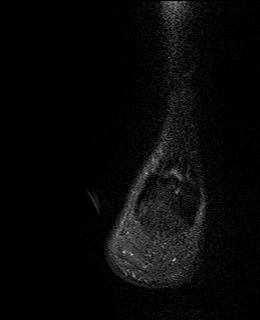
[im 41/41]
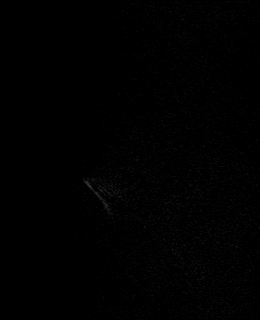

[38 of 40 positions shown; findings below may reference images not displayed]

FINDINGS: TENDONS

Peroneal: Peroneal longus tendon intact. Short segment split tear of
the peroneal brevis tendon inferior to the lateral malleolus,
measuring approximately 1.6 cm in length (series 4, images 18-21).

Posteromedial: Posterior tibial tendon intact with mild tendinosis
and small amount fluid in the tendon sheath. Flexor digitorum longus
tendon intact. Flexor hallucis longus tendon intact.

Anterior: Tibialis anterior tendon intact. Extensor hallucis longus
tendon intact Extensor digitorum longus tendon intact.

Achilles:  Intact.

Plantar Fascia: Intact. Mild thickening of the proximal central band
with surrounding soft tissue swelling (series 10, image 12).

LIGAMENTS

Lateral: Anterior talofibular ligament intact. Calcaneofibular
ligament intact. Posterior talofibular ligament intact. Anterior and
posterior tibiofibular ligaments intact.

Medial: Deltoid ligament intact. Spring ligament intact.

CARTILAGE

Ankle Joint: No joint effusion. Normal ankle mortise. No chondral
defect.

Subtalar Joints/Sinus Tarsi: Normal subtalar joints. No subtalar
joint effusion. Normal sinus tarsi.

Bones: No marrow signal abnormality.  No fracture or dislocation.

Soft Tissue: No soft tissue mass or fluid collection.
IMPRESSION: 1. Mild plantar fasciitis. No tear.
2. Short segment split tear of the peroneal brevis tendon inferior
to the lateral malleolus.
3. Mild posterior tibial tendinosis and tenosynovitis.

## 2020-05-11 ENCOUNTER — Other Ambulatory Visit: Payer: Self-pay

## 2020-05-11 ENCOUNTER — Ambulatory Visit: Payer: BC Managed Care – PPO | Admitting: Podiatry

## 2020-05-11 ENCOUNTER — Encounter: Payer: Self-pay | Admitting: Podiatry

## 2020-05-11 DIAGNOSIS — S93692D Other sprain of left foot, subsequent encounter: Secondary | ICD-10-CM

## 2020-05-11 NOTE — Progress Notes (Signed)
He presents today for follow-up of his MRI.  He states that the foot is the same he states is been this way for more than a year now and he is getting to the point where he is ready to do anything to help alleviate his symptoms.  Objective: Vital signs are stable he is alert oriented x3 severe pain on palpation medial calcaneal tubercle.  MRI does suggest that he has a central band Planter fasciitis.  It also goes on to say that there is a short segment split tear of the peroneal tendon and tendinosis of the posterior tibial tendon neither of these hurt him or bother him.  Assessment: Chronic plan fasciitis central band left.  Plan: Discussed etiology pathology conservative surgical therapies at this point unguinal go ahead and consent him for an endoscopic plantar fasciotomy possibly a total he understands this is amendable to a we did discuss possible postop complications which may include but not limited to postop pain bleeding swelling infection recurrence need further surgery overcorrection under correction loss of digit loss of limb loss of life he also understands that this may compromise the integrity of the arch and he that he will have to wear orthotics afterwards.  I will follow-up with him in the near future for surgical intervention.

## 2020-05-17 ENCOUNTER — Telehealth: Payer: Self-pay | Admitting: Urology

## 2020-05-17 NOTE — Telephone Encounter (Signed)
DOS - 05/26/20  EPF LEFT  --- [id number]   [employer]BS EFFECTIVE DATE - 01/08/20   PLAN DEDUCTIBLE - $1,200.00 W/ $367.76 REMAINING OUT OF POCKET - $3,200.00 W/ $2,119.80 REMAINING COINSURANCE - 20% COPAY - $0.00   NO PRIOR AUTH REQUIRED

## 2020-05-24 ENCOUNTER — Other Ambulatory Visit: Payer: Self-pay | Admitting: Podiatry

## 2020-05-24 MED ORDER — ONDANSETRON HCL 4 MG PO TABS: 4.0000 mg | ORAL_TABLET | Freq: Three times a day (TID) | ORAL | 0 refills | Status: DC | PRN

## 2020-05-24 MED ORDER — CEPHALEXIN 500 MG PO CAPS: 500.0000 mg | ORAL_CAPSULE | Freq: Three times a day (TID) | ORAL | 0 refills | Status: DC

## 2020-05-24 MED ORDER — OXYCODONE-ACETAMINOPHEN 10-325 MG PO TABS: 1.0000 | ORAL_TABLET | Freq: Three times a day (TID) | ORAL | 0 refills | Status: AC | PRN

## 2020-05-26 DIAGNOSIS — M25571 Pain in right ankle and joints of right foot: Secondary | ICD-10-CM | POA: Diagnosis not present

## 2020-05-26 DIAGNOSIS — M722 Plantar fascial fibromatosis: Secondary | ICD-10-CM | POA: Diagnosis not present

## 2020-05-31 DIAGNOSIS — R350 Frequency of micturition: Secondary | ICD-10-CM | POA: Diagnosis not present

## 2020-05-31 DIAGNOSIS — R351 Nocturia: Secondary | ICD-10-CM | POA: Diagnosis not present

## 2020-05-31 DIAGNOSIS — N201 Calculus of ureter: Secondary | ICD-10-CM | POA: Diagnosis not present

## 2020-06-01 ENCOUNTER — Ambulatory Visit (INDEPENDENT_AMBULATORY_CARE_PROVIDER_SITE_OTHER): Payer: BC Managed Care – PPO | Admitting: Podiatry

## 2020-06-01 ENCOUNTER — Other Ambulatory Visit: Payer: Self-pay

## 2020-06-01 DIAGNOSIS — Q828 Other specified congenital malformations of skin: Secondary | ICD-10-CM

## 2020-06-01 DIAGNOSIS — S93692D Other sprain of left foot, subsequent encounter: Secondary | ICD-10-CM

## 2020-06-01 DIAGNOSIS — Z9889 Other specified postprocedural states: Secondary | ICD-10-CM

## 2020-06-04 NOTE — Progress Notes (Signed)
He presents today for follow-up of his endoscopic plantar fasciotomy date of surgery 05/26/2020 denies fever chills nausea vomiting muscle aches pains calf pain back pain chest pain shortness of breath states that has been staying in the cam walker at all times.  Objective: Dressed her dressing intact was removed demonstrates no erythema edema cellulitis drainage or odor sutures are intact margins well coapted minimal ecchymosis no erythema cellulitis or odor associated with this.  Assessment: Well-healing endoscopic plantar fasciotomy date of surgery 05/26/2020.  Plan: Place Band-Aids today with an Ace bandage and instructed him to continue to keep this elevated and to wear the boot for 24 hours a day for another week.  Follow-up with him for suture removal in 1 week

## 2020-06-08 ENCOUNTER — Ambulatory Visit (INDEPENDENT_AMBULATORY_CARE_PROVIDER_SITE_OTHER): Payer: BC Managed Care – PPO | Admitting: Podiatry

## 2020-06-08 ENCOUNTER — Encounter: Payer: Self-pay | Admitting: Podiatry

## 2020-06-08 ENCOUNTER — Other Ambulatory Visit: Payer: Self-pay

## 2020-06-08 DIAGNOSIS — Z9889 Other specified postprocedural states: Secondary | ICD-10-CM

## 2020-06-08 DIAGNOSIS — S93692D Other sprain of left foot, subsequent encounter: Secondary | ICD-10-CM

## 2020-06-09 DIAGNOSIS — R110 Nausea: Secondary | ICD-10-CM | POA: Diagnosis not present

## 2020-06-09 DIAGNOSIS — R1032 Left lower quadrant pain: Secondary | ICD-10-CM | POA: Diagnosis not present

## 2020-06-09 DIAGNOSIS — N201 Calculus of ureter: Secondary | ICD-10-CM | POA: Diagnosis not present

## 2020-06-10 NOTE — Progress Notes (Signed)
He presents today for postop visit date of surgery 05/26/2020 status post endoscopic plantar fasciotomy left foot.  States that there is really been no pain just some ankle discomfort and may be from being in the boot.  Objective: Vital signs are stable alert oriented x3.  There is no erythema edema/drainage or odor sutures are in place margins well coapted.  Assessment well-healing surgical foot status post endoscopic fasciotomy left.  Plan: Remove sutures today placed Band-Aids and allow him to start washing this foot.  I am also going to let him start utilizing his tennis shoe during the day and wear his cam walker at night.  He understands this and is amenable to it he understands also that that may be a period of time where he may have to switch back and forth between the cam walker and his tennis shoe for discomfort.  I will follow-up with him in 2 weeks questions or concerns he will notify us immediately.

## 2020-06-14 ENCOUNTER — Encounter: Payer: Self-pay | Admitting: Podiatry

## 2020-06-19 DIAGNOSIS — N50812 Left testicular pain: Secondary | ICD-10-CM | POA: Diagnosis not present

## 2020-06-19 DIAGNOSIS — R102 Pelvic and perineal pain: Secondary | ICD-10-CM | POA: Diagnosis not present

## 2020-06-27 ENCOUNTER — Encounter: Payer: BC Managed Care – PPO | Admitting: Podiatry

## 2020-07-04 ENCOUNTER — Ambulatory Visit (INDEPENDENT_AMBULATORY_CARE_PROVIDER_SITE_OTHER): Payer: BC Managed Care – PPO | Admitting: Podiatry

## 2020-07-04 ENCOUNTER — Other Ambulatory Visit: Payer: Self-pay

## 2020-07-04 DIAGNOSIS — S93692D Other sprain of left foot, subsequent encounter: Secondary | ICD-10-CM

## 2020-07-04 DIAGNOSIS — Z9889 Other specified postprocedural states: Secondary | ICD-10-CM

## 2020-07-05 NOTE — Addendum Note (Signed)
Addended by: Lottie Rater E on: 07/05/2020 10:05 AM   Modules accepted: Orders

## 2020-07-05 NOTE — Progress Notes (Signed)
He presents today for follow-up of his endoscopic plantar fasciotomy left foot date of surgery 05/26/2020 states that he is having soreness in his left heel and surgical scar area.  He also goes on to state that he started experiencing soreness 2 to 3 weeks ago feels that when he transitions from his cam walker to his orthotic that he is stepping on a ball.  He states that is considerably sore even the ball of his foot is becoming tender.  Objective: Vital signs are stable he is alert oriented x3.  Pulses are palpable.  There is no erythema edema cellulitis drainage or odor he has tenderness on palpation of the surgical scar and the cannula tract.  Assessment: Most likely just some scar tissue and soreness from the musculature beneath the fascia.  At this point this should go on to resolve.  Plan: I think sending him to physical therapy and helping with the scar tissue and the strengthening of the intrinsic muscles will benefit him.  [facility].

## 2020-07-11 ENCOUNTER — Encounter: Payer: BC Managed Care – PPO | Admitting: Podiatry

## 2020-11-01 DIAGNOSIS — R0789 Other chest pain: Secondary | ICD-10-CM | POA: Diagnosis not present

## 2020-11-01 DIAGNOSIS — M542 Cervicalgia: Secondary | ICD-10-CM | POA: Diagnosis not present

## 2020-11-23 ENCOUNTER — Emergency Department (HOSPITAL_BASED_OUTPATIENT_CLINIC_OR_DEPARTMENT_OTHER)
Admission: EM | Admit: 2020-11-23 | Discharge: 2020-11-23 | Disposition: A | Discharge status: Home / Self Care | Payer: BC Managed Care – PPO | Attending: Emergency Medicine | Admitting: Emergency Medicine

## 2020-11-23 ENCOUNTER — Other Ambulatory Visit: Payer: Self-pay

## 2020-11-23 ENCOUNTER — Encounter (HOSPITAL_BASED_OUTPATIENT_CLINIC_OR_DEPARTMENT_OTHER): Payer: Self-pay

## 2020-11-23 ENCOUNTER — Emergency Department (HOSPITAL_BASED_OUTPATIENT_CLINIC_OR_DEPARTMENT_OTHER): Payer: BC Managed Care – PPO

## 2020-11-23 DIAGNOSIS — M50323 Other cervical disc degeneration at C6-C7 level: Secondary | ICD-10-CM | POA: Diagnosis not present

## 2020-11-23 DIAGNOSIS — Y92512 Supermarket, store or market as the place of occurrence of the external cause: Secondary | ICD-10-CM | POA: Diagnosis not present

## 2020-11-23 DIAGNOSIS — M542 Cervicalgia: Secondary | ICD-10-CM | POA: Diagnosis not present

## 2020-11-23 DIAGNOSIS — W208XXA Other cause of strike by thrown, projected or falling object, initial encounter: Secondary | ICD-10-CM | POA: Insufficient documentation

## 2020-11-23 DIAGNOSIS — Z8616 Personal history of COVID-19: Secondary | ICD-10-CM | POA: Insufficient documentation

## 2020-11-23 DIAGNOSIS — R42 Dizziness and giddiness: Secondary | ICD-10-CM | POA: Diagnosis not present

## 2020-11-23 DIAGNOSIS — S060X0A Concussion without loss of consciousness, initial encounter: Secondary | ICD-10-CM | POA: Diagnosis not present

## 2020-11-23 DIAGNOSIS — S0990XA Unspecified injury of head, initial encounter: Secondary | ICD-10-CM | POA: Diagnosis not present

## 2020-11-23 HISTORY — DX: Pneumonia due to coronavirus disease 2019: J12.82

## 2020-11-23 HISTORY — DX: COVID-19: U07.1

## 2020-11-23 IMAGING — CT CT HEAD W/O CM
3 of 4 series · 15 of 47 positions shown, 18 images · non-contrast
Comparison: None.

CLINICAL DATA: Dizziness and neck pain after head injury today.

EXAM:
CT HEAD WITHOUT CONTRAST
CT CERVICAL SPINE WITHOUT CONTRAST
TECHNIQUE: Multidetector CT imaging of the head and cervical spine was
performed following the standard protocol without intravenous
contrast. Multiplanar CT image reconstructions of the cervical spine
were also generated.

[Series 3: head 2.0 h70h · axial · 0.46mm/px · z∈[-162,-22]mm · 9 of 88 slices shown, 12 images]
[im 9/88  brain]
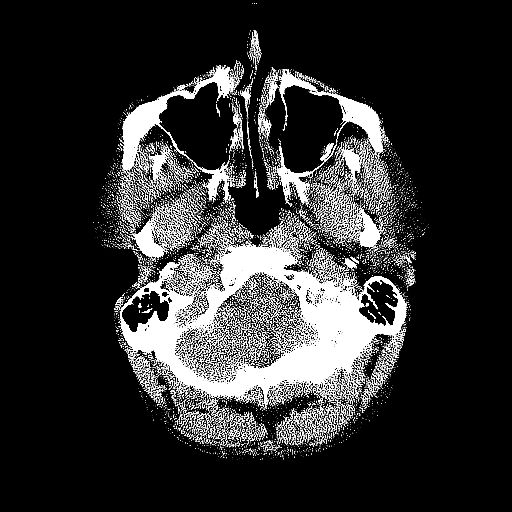
[im 9/88  bone]
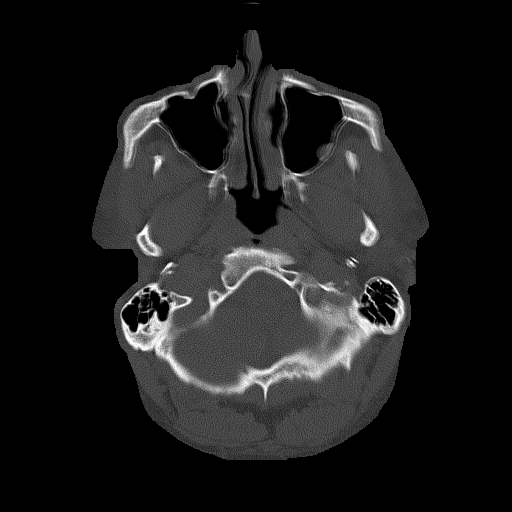
[im 18/88  brain]
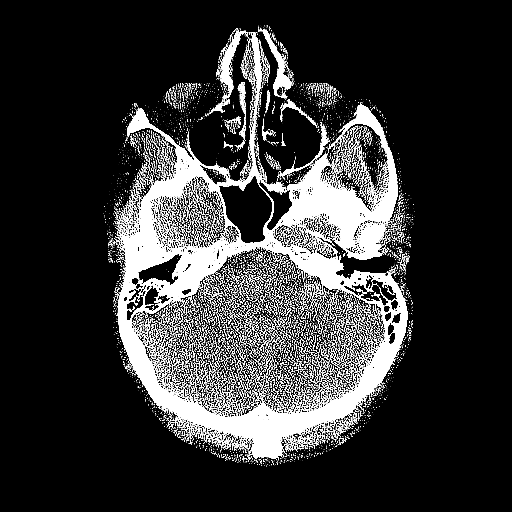
[im 27/88  brain]
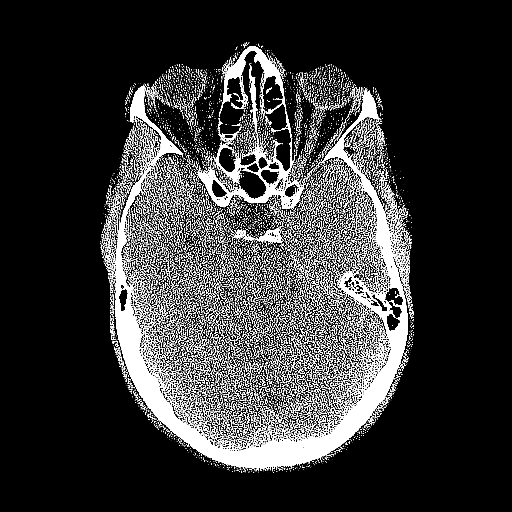
[im 35/88  brain]
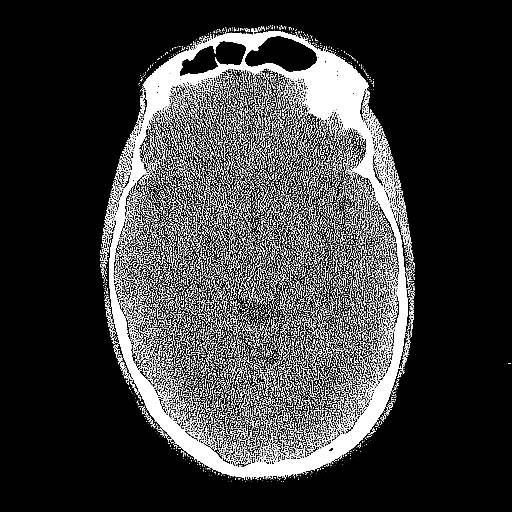
[im 44/88  brain]
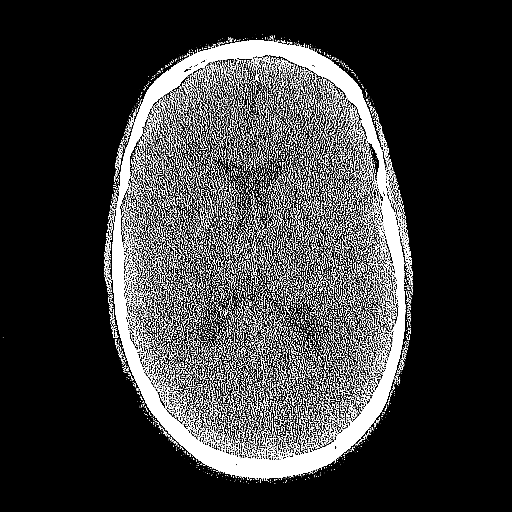
[im 44/88  bone]
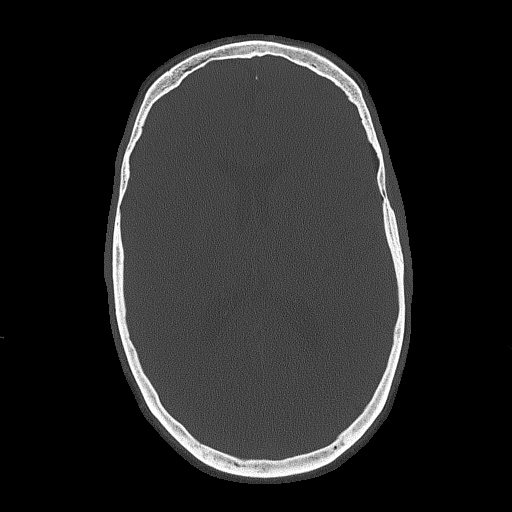
[im 53/88  brain]
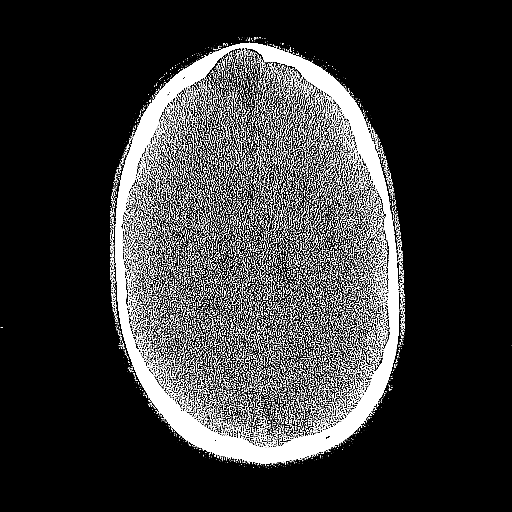
[im 61/88  brain]
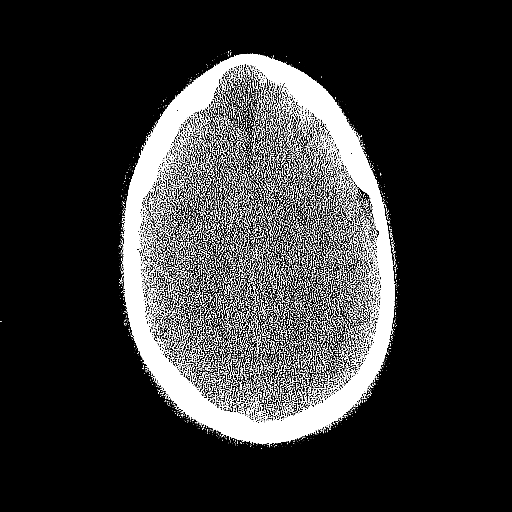
[im 70/88  brain]
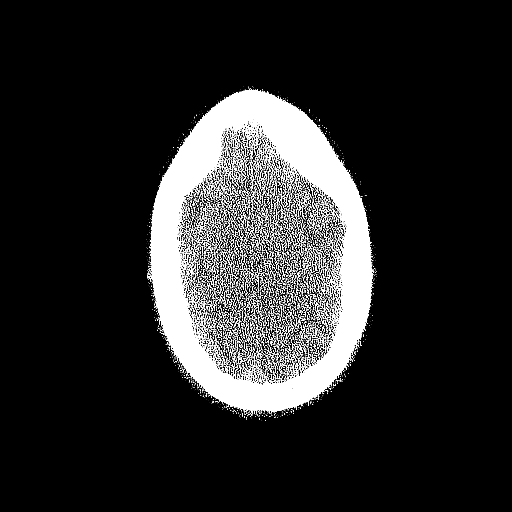
[im 79/88  brain]
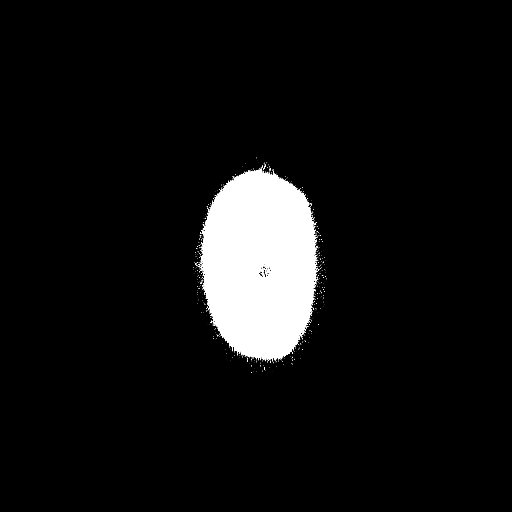
[im 79/88  bone]
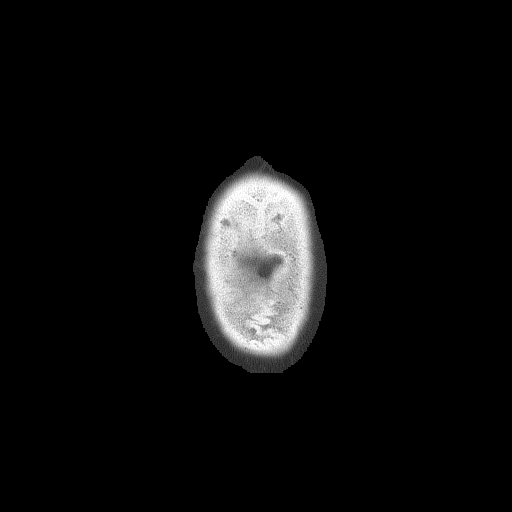

[Series 4: head 3.0 mpr cor · coronal · 0.38mm/px · 3 of 76 slices shown]
[im 26/76  brain]
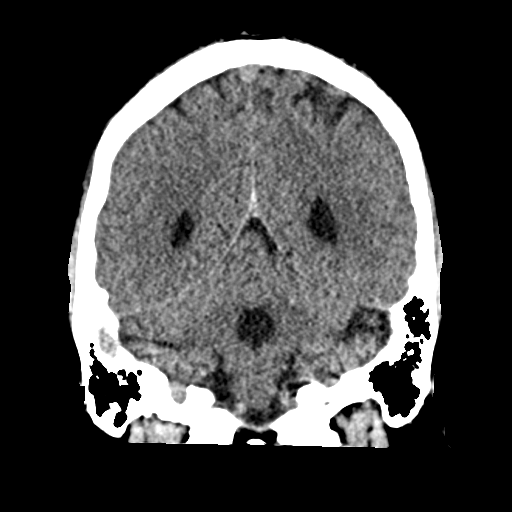
[im 34/76  brain]
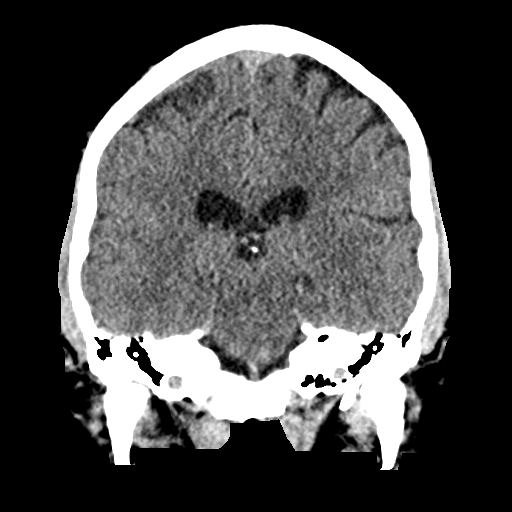
[im 42/76  brain]
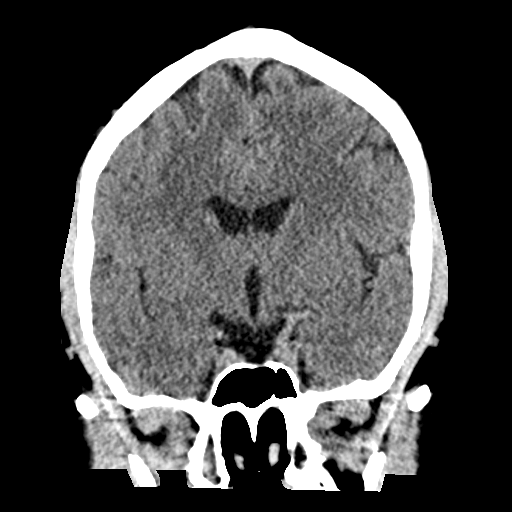

[Series 5: head 3.0 mpr sag · sagittal · 0.40mm/px · 3 of 57 slices shown]
[im 19/57  brain]
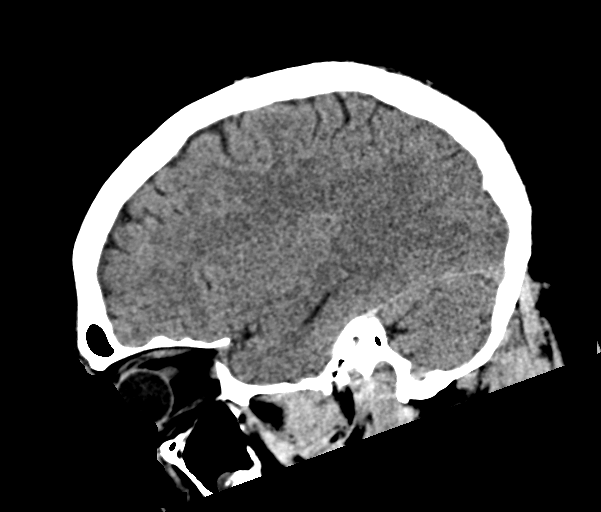
[im 29/57  brain]
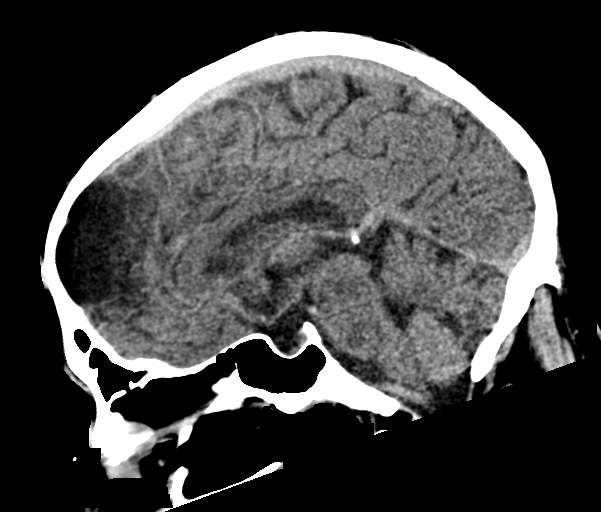
[im 38/57  brain]
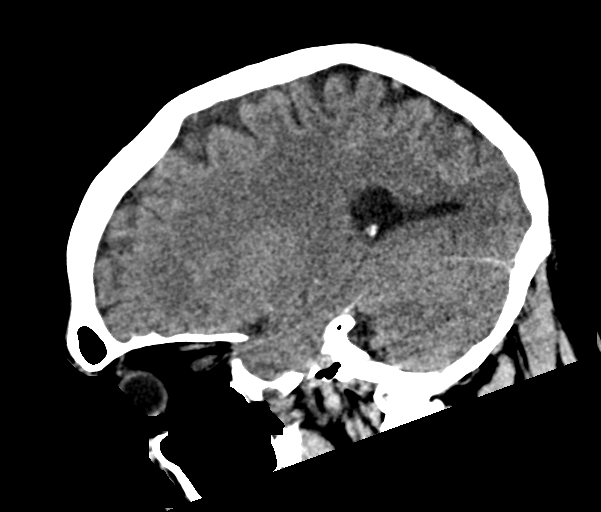

[15 of 47 positions shown; findings below may reference images not displayed]

FINDINGS: CT HEAD FINDINGS

Brain: There is either right frontal encephalomalacia or arachnoid
cyst seen in right frontal region. No midline shift is noted. No
hemorrhage or infarction is noted. Ventricular size is within normal
limits.

Vascular: No hyperdense vessel or unexpected calcification.

Skull: Normal. Negative for fracture or focal lesion.

Sinuses/Orbits: No acute finding.

Other: None.

CT CERVICAL SPINE FINDINGS

Alignment: Normal.

Skull base and vertebrae: No acute fracture. No primary bone lesion
or focal pathologic process.

Soft tissues and spinal canal: No prevertebral fluid or swelling. No
visible canal hematoma.

Disc levels:  Moderate degenerative disc disease is noted at C6-7.

Upper chest: Negative.

Other: None.
IMPRESSION: Right frontal low density is noted which may represent either right
frontal encephalomalacia or possibly arachnoid cyst. MRI may be
performed for further evaluation. No acute intracranial abnormality
is noted.

Moderate degenerative disc disease is noted at C6-7. No acute
abnormality seen in the cervical spine.

## 2020-11-23 IMAGING — CT CT CERVICAL SPINE W/O CM
3 of 4 series · 14 of 33 positions shown, 17 images · non-contrast
Comparison: None.

CLINICAL DATA: Dizziness and neck pain after head injury today.

EXAM:
CT HEAD WITHOUT CONTRAST
CT CERVICAL SPINE WITHOUT CONTRAST
TECHNIQUE: Multidetector CT imaging of the head and cervical spine was
performed following the standard protocol without intravenous
contrast. Multiplanar CT image reconstructions of the cervical spine
were also generated.

[Series 2: coronals · coronal · 0.36mm/px · 3 of 84 slices shown]
[im 21/84  bone]
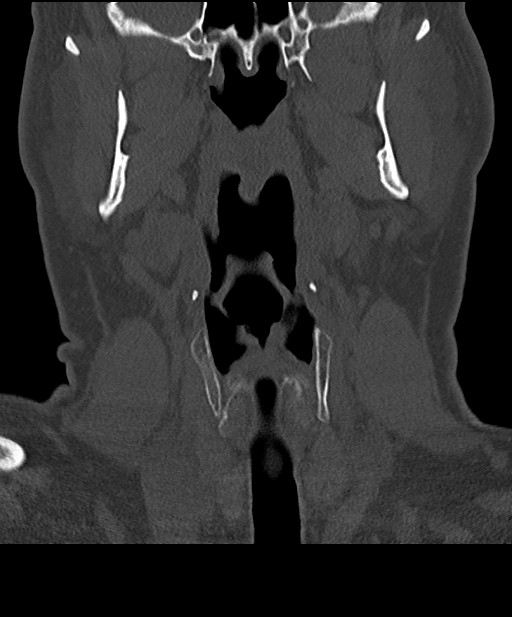
[im 35/84  bone]
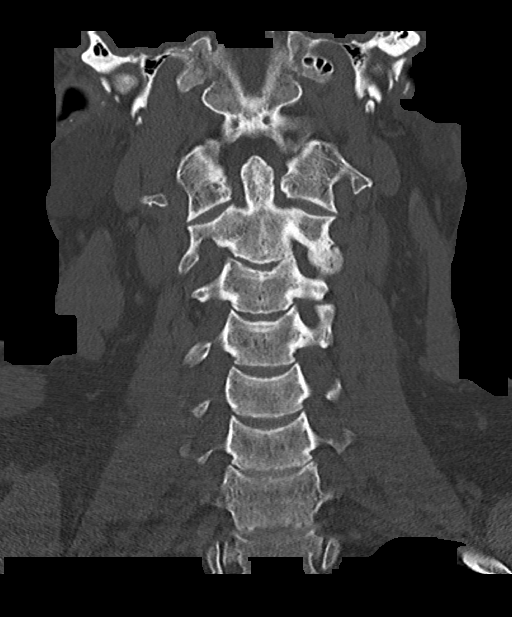
[im 49/84  bone]
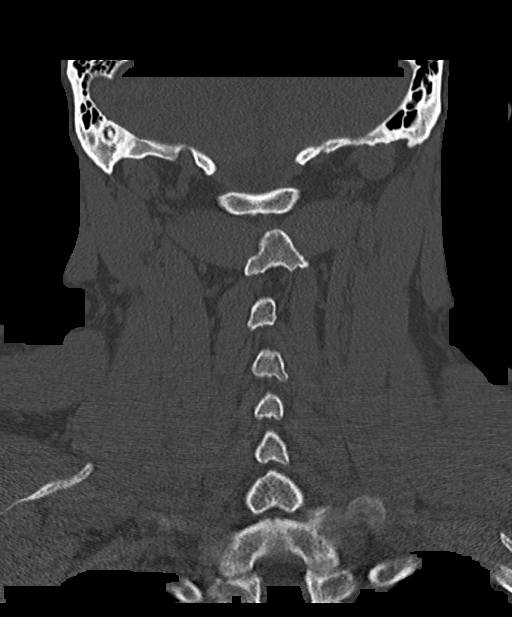

[Series 6: sagittals · sagittal · 0.34mm/px · 5 of 83 slices shown, 6 images]
[im 28/83  bone]
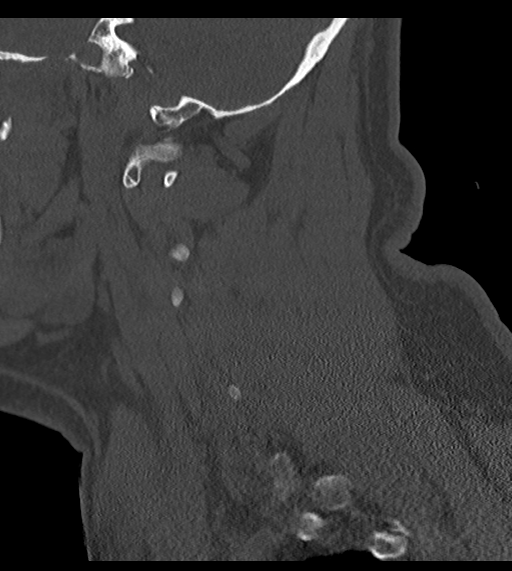
[im 35/83  bone]
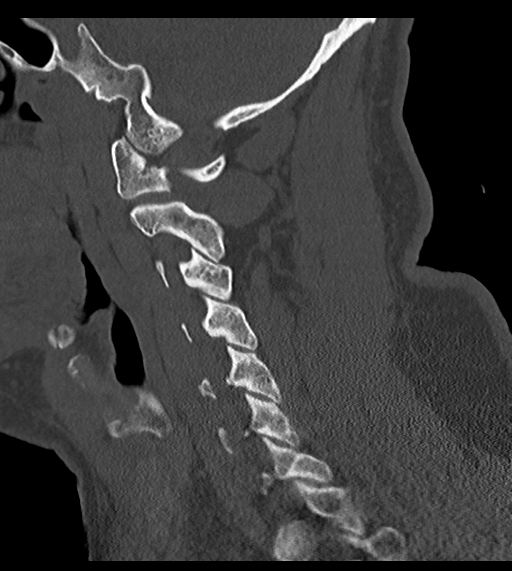
[im 42/83  soft-tissue]
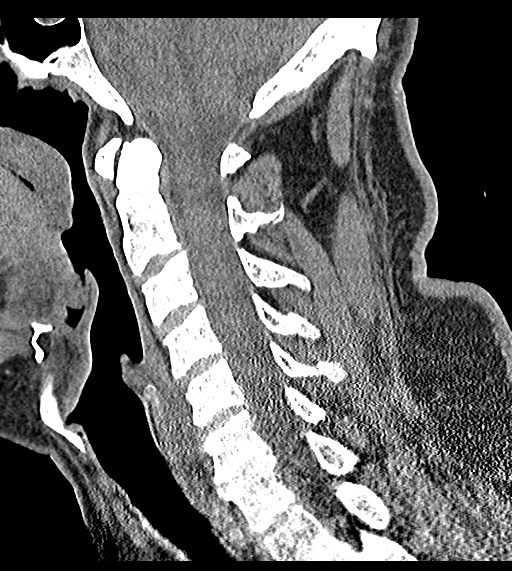
[im 42/83  bone]
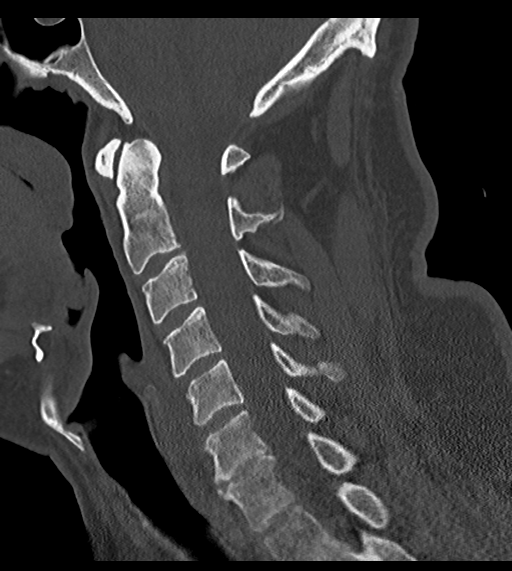
[im 48/83  bone]
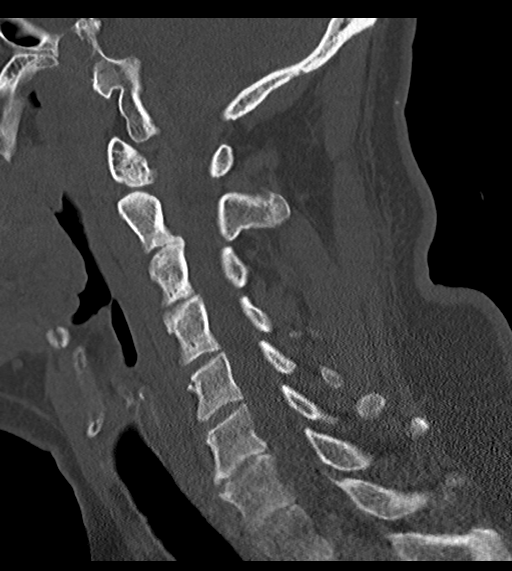
[im 55/83  bone]
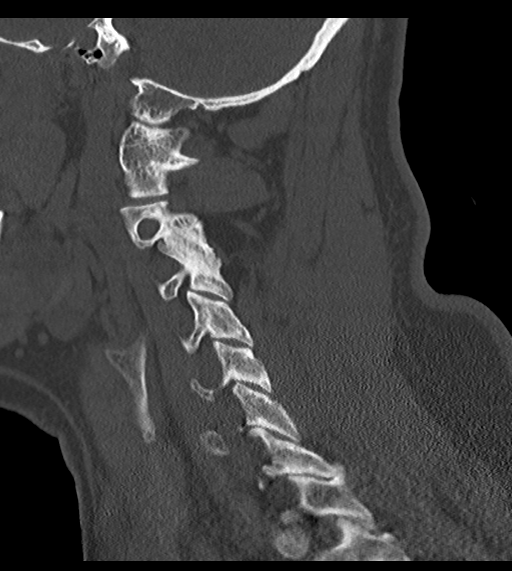

[Series 7: orthogonals · axial · 0.28mm/px · z∈[-331,-175]mm · 6 of 117 slices shown, 8 images]
[im 17/117  soft-tissue]
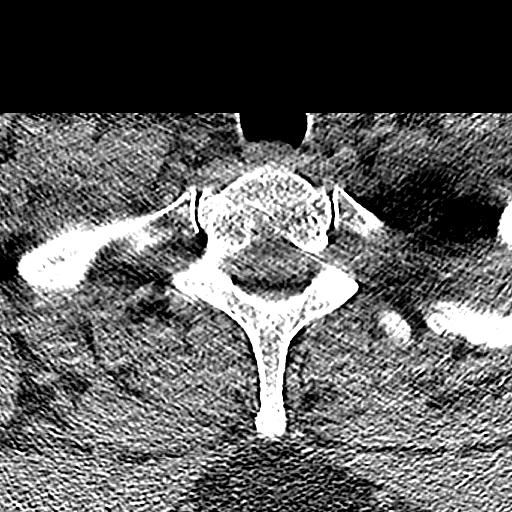
[im 17/117  bone]
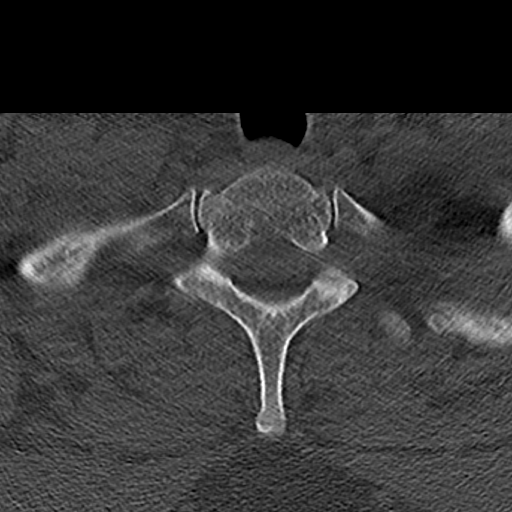
[im 34/117  bone]
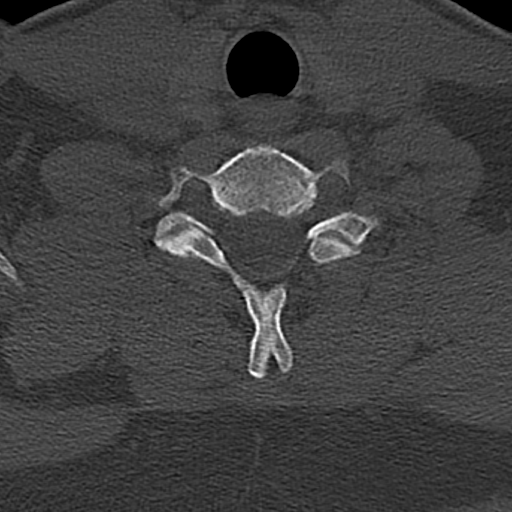
[im 50/117  bone]
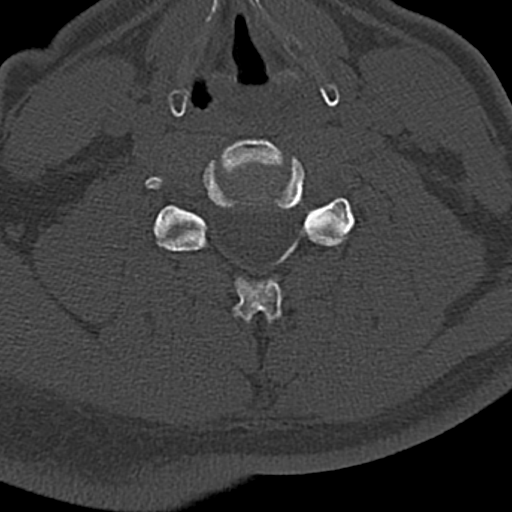
[im 67/117  bone]
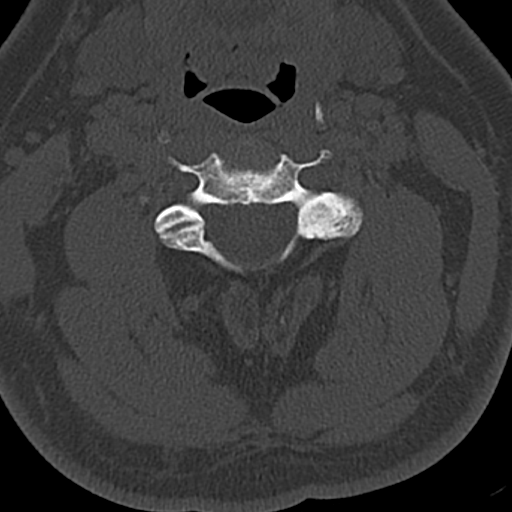
[im 83/117  soft-tissue]
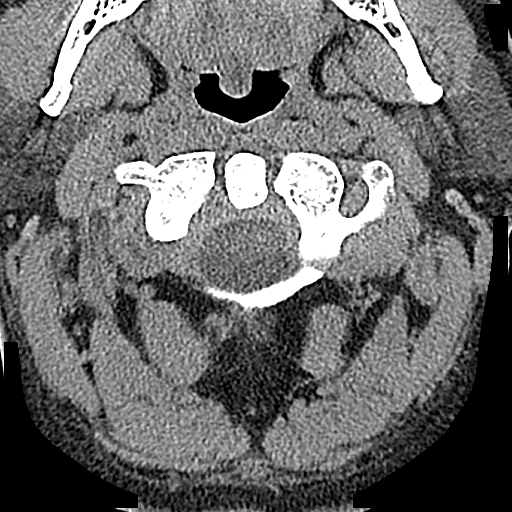
[im 83/117  bone]
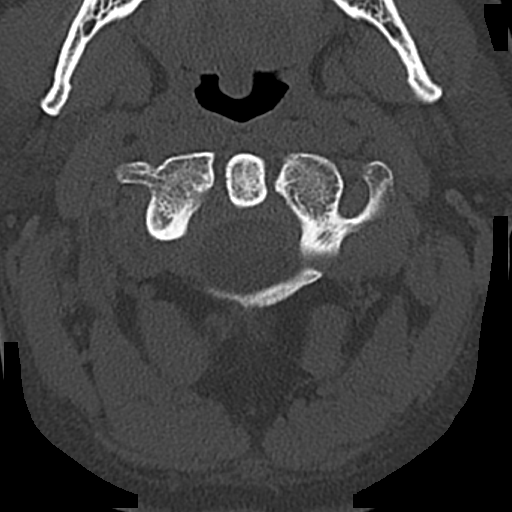
[im 100/117  bone]
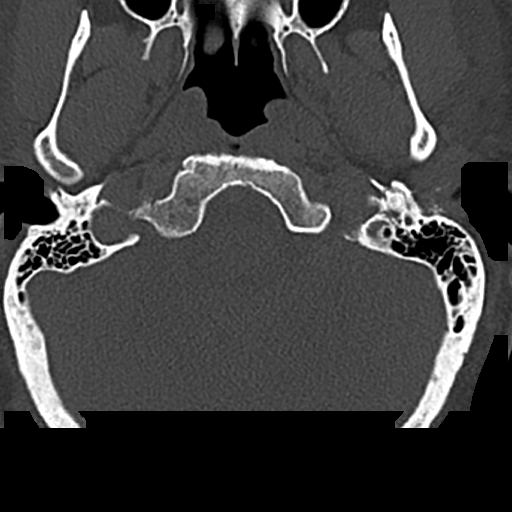

[14 of 33 positions shown; findings below may reference images not displayed]

FINDINGS: CT HEAD FINDINGS

Brain: There is either right frontal encephalomalacia or arachnoid
cyst seen in right frontal region. No midline shift is noted. No
hemorrhage or infarction is noted. Ventricular size is within normal
limits.

Vascular: No hyperdense vessel or unexpected calcification.

Skull: Normal. Negative for fracture or focal lesion.

Sinuses/Orbits: No acute finding.

Other: None.

CT CERVICAL SPINE FINDINGS

Alignment: Normal.

Skull base and vertebrae: No acute fracture. No primary bone lesion
or focal pathologic process.

Soft tissues and spinal canal: No prevertebral fluid or swelling. No
visible canal hematoma.

Disc levels:  Moderate degenerative disc disease is noted at C6-7.

Upper chest: Negative.

Other: None.
IMPRESSION: Right frontal low density is noted which may represent either right
frontal encephalomalacia or possibly arachnoid cyst. MRI may be
performed for further evaluation. No acute intracranial abnormality
is noted.

Moderate degenerative disc disease is noted at C6-7. No acute
abnormality seen in the cervical spine.

## 2020-11-23 MED ORDER — METHOCARBAMOL 500 MG PO TABS: 500.0000 mg | ORAL_TABLET | Freq: Two times a day (BID) | ORAL | 0 refills | Status: DC

## 2020-11-23 MED ORDER — ONDANSETRON 4 MG PO TBDP: 4.0000 mg | ORAL_TABLET | Freq: Three times a day (TID) | ORAL | 0 refills | Status: DC | PRN

## 2020-11-23 MED ORDER — IBUPROFEN 800 MG PO TABS
800.0000 mg | ORAL_TABLET | Freq: Once | ORAL | Status: AC
  Administered 2020-11-23: 16:00:00 800 mg via ORAL
  Filled 2020-11-23: qty 1

## 2020-11-23 MED ORDER — METOCLOPRAMIDE HCL 10 MG PO TABS
5.0000 mg | ORAL_TABLET | Freq: Once | ORAL | Status: AC
  Administered 2020-11-23: 13:00:00 5 mg via ORAL
  Filled 2020-11-23: qty 1

## 2020-11-23 MED ORDER — METHOCARBAMOL 500 MG PO TABS: 500.0000 mg | ORAL_TABLET | Freq: Two times a day (BID) | ORAL | 0 refills | Status: AC

## 2020-11-23 MED ORDER — ONDANSETRON 4 MG PO TBDP
4.0000 mg | ORAL_TABLET | Freq: Once | ORAL | Status: AC
  Administered 2020-11-23: 13:00:00 4 mg via ORAL
  Filled 2020-11-23: qty 1

## 2020-11-23 MED ORDER — METOCLOPRAMIDE HCL 10 MG PO TABS: 10.0000 mg | ORAL_TABLET | Freq: Four times a day (QID) | ORAL | 0 refills | Status: DC

## 2020-11-23 NOTE — ED Provider Notes (Addendum)
[facility] EMERGENCY DEPARTMENT Provider Note   CSN: [id number] Arrival date & time: 11/23/20  1156     History Chief Complaint  Patient presents with   Head Injury    Carl Chang is a 45 y.o. male.  HPI 45 year old male with a history of hayfever, chronic prostatitis, anxiety, vitamin D deficiency presents to ER with a head injury.  Patient states that he was at [employer] trying to pick out order for a project that he was doing for his wife when some wooden boards fell on his head.  He denies any LOC.  Complains of nausea and neck pain.  Denies any numbness or tingling in his extremities.  No blurred vision.  Denies any episodes of vomiting.  Not on blood thinners.    Past Medical History:  Diagnosis Date   Hayfever    Pneumonia due to COVID-19 virus     Patient Active Problem List   Diagnosis Date Noted   Anxiety 03/23/2020   Chronic prostatitis 03/23/2020   Radial styloid tenosynovitis 03/23/2020   Vitamin D deficiency 03/23/2020   Acute respiratory failure with hypoxia (HCC) 12/04/2018   COVID-19 virus infection 12/04/2018   Abnormal liver function 12/04/2018   Hypokalemia 12/04/2018   CAP (community acquired pneumonia) 12/04/2018    Past Surgical History:  Procedure Laterality Date   FOOT SURGERY     SHOULDER SURGERY         No family history on file.  Social History   Tobacco Use   Smoking status: Never   Smokeless tobacco: Never  Vaping Use   Vaping Use: Never used  Substance Use Topics   Alcohol use: Yes    Comment: occ   Drug use: Never    Home Medications Prior to Admission medications   Medication Sig Start Date End Date Taking? Authorizing Provider  ondansetron (ZOFRAN ODT) 4 MG disintegrating tablet Take 1 tablet (4 mg total) by mouth every 8 (eight) hours as needed for nausea or vomiting. 11/23/20  Yes Mare Ferrari, PA-C  ALPRAZolam (XANAX) 0.5 MG tablet Take 0.5 mg by mouth 2 (two) times daily as needed. 06/13/20    [provider]  fexofenadine (ALLEGRA) 180 MG tablet Take 180 mg by mouth daily.    [provider]  meloxicam (MOBIC) 15 MG tablet Take 1 tablet (15 mg total) by mouth daily. 03/23/20   Hyatt, Max T, DPM  Multiple Vitamin (MULTIVITAMIN WITH MINERALS) TABS tablet Take 1 tablet by mouth daily.    [provider]  ondansetron (ZOFRAN) 4 MG tablet Take 1 tablet (4 mg total) by mouth every 8 (eight) hours as needed. 05/24/20   Hyatt, Max T, DPM  sertraline (ZOLOFT) 100 MG tablet Take 150 mg by mouth daily. 11/23/18   [provider]  VITAMIN D PO Take by mouth.    [provider]    Allergies    Citalopram hydrobromide, Escitalopram oxalate, and Prednisone  Review of Systems   Review of Systems Ten systems reviewed and are negative for acute change, except as noted in the HPI.   Physical Exam Updated Vital Signs BP 122/87   Pulse 74   Temp 98.2 F (36.8 C) (Oral)   Resp 16   Ht 6\' 2"  (1.88 m)   Wt 114.8 kg   SpO2 95%   BMI 32.48 kg/m   Physical Exam Vitals and nursing note reviewed.  Constitutional:      General: He is not in acute distress.  Appearance: He is well-developed.  HENT:     Head: Normocephalic and atraumatic.     Comments: No of hemotympanum, raccoon eyes, battle sign.  No mastoid tenderness.  No malocclusion.  No evidence of lacerations, cranial deformities. Full range of motion of head and neck    Eyes:     Conjunctiva/sclera: Conjunctivae normal.  Cardiovascular:     Rate and Rhythm: Normal rate and regular rhythm.     Heart sounds: No murmur heard. Pulmonary:     Effort: Pulmonary effort is normal. No respiratory distress.     Breath sounds: Normal breath sounds.  Abdominal:     Palpations: Abdomen is soft.     Tenderness: There is no abdominal tenderness.  Musculoskeletal:        General: No swelling.     Cervical back: Neck supple.     Comments: No midline tenderness to the C, T, L-spine.  5/5 strength  in upper and lower extremities bilaterally.  Skin:    General: Skin is warm and dry.     Capillary Refill: Capillary refill takes less than 2 seconds.  Neurological:     General: No focal deficit present.     Mental Status: He is alert and oriented to person, place, and time.  Psychiatric:        Mood and Affect: Mood normal.    ED Results / Procedures / Treatments   Labs (all labs ordered are listed, but only abnormal results are displayed) Labs Reviewed - No data to display  EKG None  Radiology CT Head Wo Contrast  Result Date: 11/23/2020 CLINICAL DATA:  Dizziness and neck pain after head injury today. EXAM: CT HEAD WITHOUT CONTRAST CT CERVICAL SPINE WITHOUT CONTRAST TECHNIQUE: Multidetector CT imaging of the head and cervical spine was performed following the standard protocol without intravenous contrast. Multiplanar CT image reconstructions of the cervical spine were also generated. COMPARISON:  None. FINDINGS: CT HEAD FINDINGS Brain: There is either right frontal encephalomalacia or arachnoid cyst seen in right frontal region. No midline shift is noted. No hemorrhage or infarction is noted. Ventricular size is within normal limits. Vascular: No hyperdense vessel or unexpected calcification. Skull: Normal. Negative for fracture or focal lesion. Sinuses/Orbits: No acute finding. Other: None. CT CERVICAL SPINE FINDINGS Alignment: Normal. Skull base and vertebrae: No acute fracture. No primary bone lesion or focal pathologic process. Soft tissues and spinal canal: No prevertebral fluid or swelling. No visible canal hematoma. Disc levels:  Moderate degenerative disc disease is noted at C6-7. Upper chest: Negative. Other: None. IMPRESSION: Right frontal low density is noted which may represent either right frontal encephalomalacia or possibly arachnoid cyst. MRI may be performed for further evaluation. No acute intracranial abnormality is noted. Moderate degenerative disc disease is noted at  C6-7. No acute abnormality seen in the cervical spine. Electronically Signed   By: Lupita Raider M.D.   On: 11/23/2020 14:23   CT Cervical Spine Wo Contrast  Result Date: 11/23/2020 CLINICAL DATA:  Dizziness and neck pain after head injury today. EXAM: CT HEAD WITHOUT CONTRAST CT CERVICAL SPINE WITHOUT CONTRAST TECHNIQUE: Multidetector CT imaging of the head and cervical spine was performed following the standard protocol without intravenous contrast. Multiplanar CT image reconstructions of the cervical spine were also generated. COMPARISON:  None. FINDINGS: CT HEAD FINDINGS Brain: There is either right frontal encephalomalacia or arachnoid cyst seen in right frontal region. No midline shift is noted. No hemorrhage or infarction is noted. Ventricular size is within normal  limits. Vascular: No hyperdense vessel or unexpected calcification. Skull: Normal. Negative for fracture or focal lesion. Sinuses/Orbits: No acute finding. Other: None. CT CERVICAL SPINE FINDINGS Alignment: Normal. Skull base and vertebrae: No acute fracture. No primary bone lesion or focal pathologic process. Soft tissues and spinal canal: No prevertebral fluid or swelling. No visible canal hematoma. Disc levels:  Moderate degenerative disc disease is noted at C6-7. Upper chest: Negative. Other: None. IMPRESSION: Right frontal low density is noted which may represent either right frontal encephalomalacia or possibly arachnoid cyst. MRI may be performed for further evaluation. No acute intracranial abnormality is noted. Moderate degenerative disc disease is noted at C6-7. No acute abnormality seen in the cervical spine. Electronically Signed   By: Lupita Raider M.D.   On: 11/23/2020 14:23    Procedures Procedures   Medications Ordered in ED Medications  ibuprofen (ADVIL) tablet 800 mg (has no administration in time range)  metoCLOPramide (REGLAN) tablet 5 mg (5 mg Oral Given 11/23/20 1244)  ondansetron (ZOFRAN-ODT) disintegrating  tablet 4 mg (4 mg Oral Given 11/23/20 1257)    ED Course  I have reviewed the triage vital signs and the nursing notes.  Pertinent labs & imaging results that were available during my care of the patient were reviewed by me and considered in my medical decision making (see chart for details).    MDM Rules/Calculators/A&P                           45 year old male with head injury after having plexiform fall in his head.  On arrival, he is well-appearing, no acute distress, answering questions appropriately.  Physical exam overall reassuring, no evidence of maxillofacial injury, no visible cranial deformities.  No cervical spine tenderness with full range of motion and strength in upper and lower extremities bilaterally.  Suspect concussion type symptoms, however patient did have 1 episode of vomiting after getting Reglan and that thus CT head and neck was ordered.   CT head/cpsine with findings below:  MPRESSION:  Right frontal low density is noted which may represent either right  frontal encephalomalacia or possibly arachnoid cyst. MRI may be  performed for further evaluation. No acute intracranial abnormality  is noted.     Moderate degenerative disc disease is noted at C6-7. No acute  abnormality seen in the cervical spine.      I discussed findings with the patient.  I explained that with no evidence of mass-effect or bleeding there is no indication for emergent transfer for MRI. Encouraged outpatient MRI for further deliniation. We discussed strict return precautions including worsening vomiting, sleepiness/difficult to arouse, blurry vision, syncope or any other new or concerning symptoms. Given Ibuprofen for headache.  Will provide Zofran for symptomatic nausea, Robaxin for muscle spasm. Discussed cautious use, recommended to take at night and to not drink on the medication due to sedating side effects. He voiced understanding and is agreeable.  Stable for discharge.   Case  discussed with Dr. Wilkie Aye who is agreeable to the above plan and disposition   Final Clinical Impression(s) / ED Diagnoses Final diagnoses:  Injury of head, initial encounter  Concussion without loss of consciousness, initial encounter    Rx / DC Orders ED Discharge Orders          Ordered    metoCLOPramide (REGLAN) 10 MG tablet  Every 6 hours,   Status:  Discontinued        11/23/20 1242  methocarbamol (ROBAXIN) 500 MG tablet  2 times daily,   Status:  Discontinued        11/23/20 1242    ondansetron (ZOFRAN ODT) 4 MG disintegrating tablet  Every 8 hours PRN        11/23/20 1538              Mare Ferrari, PA-C 11/23/20 1546    Horton, Clabe Seal, DO 11/25/20 [phone]

## 2020-11-23 NOTE — ED Notes (Signed)
Pt vomited after taking medication, PA notified

## 2020-11-23 NOTE — Discharge Instructions (Addendum)
You were evaluated in the Emergency Department and after careful evaluation, we did not find any emergent condition requiring admission or further testing in the hospital.  Your CT scan showed findings which may suggest encephalomalacia which is the softening of the brain tissue or a possible arachnoid cyst.  There are no changes on your CT scan that would suggest any swelling, bleeding, or any indication for an emergent MRI or transfer to another facility.  You may follow-up with your primary care doctor to schedule an outpatient MRI to further evaluate this.  You likely suffered a concussion, symptoms can include headache, nausea, sensitivity to light.  Please make sure to return to the ER if you have worsening sleepiness/difficulty arousing, vomiting, blurry vision, or any other new or concerning symptoms.  Please have someone observe you for the next 24 hours.  You may follow-up with the concussion clinic at [facility] sports medicine your symptoms continue.  You may take Tylenol or ibuprofen for neck pain, muscle relaxer (Robaxin) at night and do not drink or drive on the medication as it can make you sleepy.  You may take the prescribed nausea medicine as needed.  You may also do gentle neck stretches and icing or heat depending on what helps you better.  Thank you for allowing [place] to be a part of your care.

## 2020-11-23 NOTE — ED Triage Notes (Signed)
Pt states " a stack of wood fell on my head at [employer]" ~53min PTA-c/o nausea, pain to left parietal area, posterior neck and left shoulder-no LOC-hard cervical collar applied in triage-NAD-steady gait

## 2020-11-23 NOTE — ED Notes (Signed)
Pt provided with water for PO challenge.

## 2020-11-27 ENCOUNTER — Telehealth: Payer: Self-pay

## 2020-11-27 NOTE — Progress Notes (Signed)
[name] [name] [address] [place] [address] Phone: [phone]  Assessment and Plan:     1. Concussion without loss of consciousness, initial encounter -Acute, uncertain prognosis, initial visit - Concussion diagnosed based off of HPI and physical exam - No red flag symptoms at today's visit - Out of work for 1 week until reevaluation.  Note provided - Start light aerobic activity, decrease screen time to <2 hours -CT head and neck from ER visit on 11/23/2020 showed right frontal low-density potentially representing encephalomalacia versus arachnoid cyst, however no acute intracranial abnormality.  Moderate DDD at C6-7.  Follow-up MRI has been ordered by patient's PCP  2. Strain of neck muscle, initial encounter -Acute, initial visit - Likely acute strain of neck musculature based on HPI and physical exam.  Patient does have underlying DDD C6-7 seen on CT neck that may have led to aggravation of patient's symptoms -Start HEP for neck  3. Ataxia -Acute -Abnormal convergence and significant difficulty with balance - If no improvement at follow-up visit, would refer to vestibular therapy  4. Nausea without vomiting -Acute - May increase to Zofran 8 mg 3 times daily as needed for nausea - Avoid triggers  5. Acute post-traumatic headache, not intractable -CT head and neck from ER visit on 11/23/2020 showed right frontal low-density potentially representing encephalomalacia versus arachnoid cyst, however no acute intracranial abnormality.  Moderate DDD at C6-7.  Follow-up MRI has been ordered by patient's PCP    Date of injury was 11/23/20. Symptom severity scores of 22 and 96 today. The patient was counseled on the nature of the injury, typical course and potential options for further evaluation and treatment. Discussed the importance of compliance with recommendations. Patient stated understanding of this plan and willingness to  comply.  - Recommend light aerobic activity while keeping symptoms <3/10 as long as >48 hours from concussive event - Eliminate screen time as much as possible for first 48 hours from concussive event, then continue limited screen time  - Encouraged to RTC in 1 week for reassessment or sooner for any concerns or acute changes   Pertinent previous records reviewed include ER note from 11/23/2020, CT head 11/23/2020, CT neck 11/23/2020   Time of visit 48 minutes, which included chart review, physical exam, treatment plan, symptom severity score, VOMS, and tandem gait testing being performed, interpreted, and discussed with patient at today's visit.   Subjective:   I, Debbe Odea, am serving as a scribe for Dr. [name]  Chief Complaint: concussion like symptoms   HPI:   11/28/20 Patient is a 45 year old male presenting with concussion symptoms since a bunch of wooden board fell on his head at [employer]. Patient states today is very nauseous no vomiting, feels very lethargic and in a fog. Patient felt like yesterday was the worst of the days since the incident.  Patient states that the left side of his neck and head are very tight and sore with radiating pain down his arm with numbness, tingling, an weakness.    Concussion HPI:  - Injury date: 11/23/20   - Mechanism of injury: boards fell on head   - LOC: no  - Initial evaluation: 11/23/20  - Previous head injuries/concussions: no   - Previous imaging: yes CT of head and neck    - Social history: activities include works in mental health  Hospitalization for head injury? No Diagnosed/treated for headache disorder or migraines? No Diagnosed with learning  disability [name]? No Diagnosed with ADD/ADHD? No Diagnose with Depression, anxiety, or other Psychiatric Disorder? Yes anxiety   Current medications:  Current Outpatient Medications  Medication Sig Dispense Refill   fexofenadine (ALLEGRA) 180 MG tablet Take 180 mg  by mouth daily.     Multiple Vitamin (MULTIVITAMIN WITH MINERALS) TABS tablet Take 1 tablet by mouth daily.     sertraline (ZOLOFT) 100 MG tablet Take 150 mg by mouth daily.     VITAMIN D PO Take by mouth.     ALPRAZolam (XANAX) 0.5 MG tablet Take 0.5 mg by mouth 2 (two) times daily as needed. (Patient not taking: Reported on 11/28/2020)     meloxicam (MOBIC) 15 MG tablet Take 1 tablet (15 mg total) by mouth daily. (Patient not taking: Reported on 11/28/2020) 30 tablet 3   methocarbamol (ROBAXIN) 500 MG tablet Take 1 tablet (500 mg total) by mouth 2 (two) times daily. (Patient not taking: Reported on 11/28/2020) 20 tablet 0   ondansetron (ZOFRAN ODT) 4 MG disintegrating tablet Take 1 tablet (4 mg total) by mouth every 8 (eight) hours as needed for nausea or vomiting. (Patient not taking: Reported on 11/28/2020) 20 tablet 0   ondansetron (ZOFRAN) 4 MG tablet Take 1 tablet (4 mg total) by mouth every 8 (eight) hours as needed. (Patient not taking: Reported on 11/28/2020) 20 tablet 0   No current facility-administered medications for this visit.      Objective:     Vitals:   11/28/20 0955  BP: 130/90  Pulse: 73  SpO2: 97%  Weight: 154 lb (69.9 kg)  Height: 6\' 2"  (1.88 m)      Body mass index is 19.77 kg/m.    Physical Exam:     General: Well-appearing, cooperative, sitting comfortably in no acute distress.  Psychiatric: Mood and affect are appropriate.     Today's Symptom Severity Score:  Scores: 0-6  Headache:5 "Pressure in head":5  Neck Pain:5  Nausea or vomiting:4  Dizziness:5  Blurred vision:3 Balance problems:4  Sensitivity to light:4  Sensitivity to noise:3  Feeling slowed down:6  Feeling like "in a fog":6  "Don't feel right":6  Difficulty concentrating:5  Difficulty remembering:4  Fatigue or low energy:6  Confusion:4  Drowsiness:6  More emotional:4  Irritability:3  Sadness:5  Nervous or Anxious:4  Trouble falling asleep:4   Total number of symptoms:  22/22  Symptom Severity index: 96/132  Worse with physical activity? Yes Worse with mental activity? Yes  Percent improved since injury: 0%    Full pain-free cervical PROM: No, discomfort in neck with rotation and sidebending   Tandem gait: - Forward, eyes open: 2 errors - Backward, eyes open: 4 errors - Forward, eyes closed: Stopped due to instability - Backward, eyes closed: Stopped due to instability  VOMS:   - Baseline symptoms: Mild nausea and headache - Smooth pursuits: Dizzy 5/10  - Vertical Saccades: Headache 7/10  - Horizontal Saccades: Headache 7/10  - Vertical Vestibular-Ocular Reflex: Dizzy 6/10  - Horizontal Vestibular-Ocular Reflex: Dizzy 8/10  - Visual Motion Sensitivity Test: Dizzy 6/10  - Convergence: 15, 15 cm (<5 cm normal)     Electronically signed by:  D[name] 10:47 AM 11/28/20

## 2020-11-28 ENCOUNTER — Other Ambulatory Visit: Payer: Self-pay | Admitting: Family Medicine

## 2020-11-28 ENCOUNTER — Other Ambulatory Visit: Payer: Self-pay

## 2020-11-28 ENCOUNTER — Ambulatory Visit (INDEPENDENT_AMBULATORY_CARE_PROVIDER_SITE_OTHER): Payer: BC Managed Care – PPO | Admitting: Sports Medicine

## 2020-11-28 VITALS — BP 130/90 | HR 73 | Ht 74.0 in | Wt 154.0 lb

## 2020-11-28 DIAGNOSIS — R9089 Other abnormal findings on diagnostic imaging of central nervous system: Secondary | ICD-10-CM | POA: Diagnosis not present

## 2020-11-28 DIAGNOSIS — S060X0D Concussion without loss of consciousness, subsequent encounter: Secondary | ICD-10-CM | POA: Diagnosis not present

## 2020-11-28 DIAGNOSIS — S060X0A Concussion without loss of consciousness, initial encounter: Secondary | ICD-10-CM

## 2020-11-28 DIAGNOSIS — R110 Nausea: Secondary | ICD-10-CM | POA: Diagnosis not present

## 2020-11-28 DIAGNOSIS — S161XXA Strain of muscle, fascia and tendon at neck level, initial encounter: Secondary | ICD-10-CM | POA: Diagnosis not present

## 2020-11-28 DIAGNOSIS — M542 Cervicalgia: Secondary | ICD-10-CM | POA: Diagnosis not present

## 2020-11-28 DIAGNOSIS — G44319 Acute post-traumatic headache, not intractable: Secondary | ICD-10-CM

## 2020-11-28 DIAGNOSIS — R270 Ataxia, unspecified: Secondary | ICD-10-CM

## 2020-11-28 NOTE — Patient Instructions (Addendum)
Good to see you  Work note given Home exercises for neck given Can take  up to 8mg  of Zofran for nausea  Limit physical and mental activity to less than 3/10 Follow up in 1 week

## 2020-12-04 ENCOUNTER — Other Ambulatory Visit: Payer: Self-pay | Admitting: Family Medicine

## 2020-12-04 DIAGNOSIS — R9089 Other abnormal findings on diagnostic imaging of central nervous system: Secondary | ICD-10-CM

## 2020-12-04 NOTE — Progress Notes (Signed)
[name] [name] [address] [place] [address] Phone: [phone]  Assessment and Plan:     1. Concussion without loss of consciousness, initial encounter -Acute, mild improvement, subsequent visit - Continued concussion symptoms with mild improvement - No red flag symptoms at today's visit - Patient has brain MRI scheduled on 12/16/2020 from PCP due to CT head findings on 11/23/2020 of right frontal low-density potentially representing encephalomalacia versus arachnoid cyst - Out of work for 1 week until reevaluation.  Work note provided  2. Strain of neck muscle, initial encounter -Acute, improved  3. Ataxia -Acute, mild improvement - Continued significant vestibular symptoms and abnormal convergence - Referral to vestibular therapy  4. Nausea without vomiting -Acute, improving - Moderately improved symptoms of nausea with no recent emesis - Can continue Zofran as needed  5. Acute post-traumatic headache, not intractable -Acute, improving - Decrease frequency after improvement of neck strain, however continuing to have daily significant headaches with triggers - Tylenol/NSAIDs as needed for pain control  6.  Insomnia - Acute, no change - Start trazodone 50 mg nightly - Start melatonin 5 mg nightly - Instructed to not take naps during the day so that he is tired at night - Goal of 78 hours of sleep nightly   Date of injury was 11/23/20. Symptom severity scores of 22 and 104 today. Original symptom severity scores were 22 and 96. The patient was counseled on the nature of the injury, typical course and potential options for further evaluation and treatment. Discussed the importance of compliance with recommendations. Patient stated understanding of this plan and willingness to comply.  - Recommend light aerobic activity while keeping symptoms <3/10 as long as >48 hours from concussive event - Eliminate screen time as much as  possible for first 48 hours from concussive event, then continue limited screen time    - Encouraged to RTC in 1 week for reassessment or sooner for any concerns or acute changes   Pertinent previous records reviewed include none   Time of visit 38 minutes, which included chart review, physical exam, treatment plan, symptom severity score, VOMS, and tandem gait testing being performed, interpreted, and discussed with patient at today's visit.   Subjective:   I, Debbe Odea, am serving as a scribe for Dr. [name]  Chief Complaint: concussion follow up  HPI:   11/28/20 Patient is a 45 year old male presenting with concussion symptoms since a bunch of wooden board fell on his head at [employer]. Patient states today is very nauseous no vomiting, feels very lethargic and in a fog. Patient felt like yesterday was the worst of the days since the incident.  Patient states that the left side of his neck and head are very tight and sore with radiating pain down his arm with numbness, tingling, an weakness.   12/05/20 Patient states that his headaches are less frequent but more intense when he does have them. Patient has not thrown up for a couple days. Patient states he is still having trouble falling asleep. The neck and arm pain is a lot better, but feels weak all over. Patient having trouble remembering common things like his phone number or pin number, patient also states that he was worn down from just trying to cook dinner and washing dishes.   Concussion HPI:  - Injury date: 11/23/20   - Mechanism of injury: boards fell on head   - LOC: no  - Initial evaluation: 11/23/20  -  Previous head injuries/concussions: no   - Previous imaging: yes CT of head and neck    - Social history: activities include works in mental health   Hospitalization for head injury? No Diagnosed/treated for headache disorder or migraines? No Diagnosed with learning disability [name]? No Diagnosed  with ADD/ADHD? No Diagnose with Depression, anxiety, or other Psychiatric Disorder? Yes anxiety  Current medications:  Current Outpatient Medications  Medication Sig Dispense Refill   fexofenadine (ALLEGRA) 180 MG tablet Take 180 mg by mouth daily.     Multiple Vitamin (MULTIVITAMIN WITH MINERALS) TABS tablet Take 1 tablet by mouth daily.     sertraline (ZOLOFT) 100 MG tablet Take 150 mg by mouth daily.     traZODone (DESYREL) 50 MG tablet Take 1 tablet (50 mg total) by mouth at bedtime. 30 tablet 0   VITAMIN D PO Take by mouth.     ALPRAZolam (XANAX) 0.5 MG tablet Take 0.5 mg by mouth 2 (two) times daily as needed. (Patient not taking: Reported on 11/28/2020)     meloxicam (MOBIC) 15 MG tablet Take 1 tablet (15 mg total) by mouth daily. (Patient not taking: Reported on 11/28/2020) 30 tablet 3   methocarbamol (ROBAXIN) 500 MG tablet Take 1 tablet (500 mg total) by mouth 2 (two) times daily. (Patient not taking: Reported on 11/28/2020) 20 tablet 0   ondansetron (ZOFRAN ODT) 4 MG disintegrating tablet Take 1 tablet (4 mg total) by mouth every 8 (eight) hours as needed for nausea or vomiting. (Patient not taking: Reported on 11/28/2020) 20 tablet 0   ondansetron (ZOFRAN) 4 MG tablet Take 1 tablet (4 mg total) by mouth every 8 (eight) hours as needed. (Patient not taking: Reported on 11/28/2020) 20 tablet 0   No current facility-administered medications for this visit.      Objective:     Vitals:   12/05/20 1007  BP: 130/86  Pulse: 90  SpO2: 96%  Weight: 154 lb (69.9 kg)  Height: 6\' 2"  (1.88 m)      Body mass index is 19.77 kg/m.    Physical Exam:     General: Well-appearing, cooperative, sitting comfortably in no acute distress.  Psychiatric: Mood and affect are appropriate.     Today's Symptom Severity Score:  Scores: 0-6  Headache:4 "Pressure in head":4  Neck Pain:1  Nausea or vomiting:5  Dizziness:5  Blurred vision:3  Balance problems:4  Sensitivity to light:4   Sensitivity to noise:3  Feeling slowed down:6  Feeling like "in a fog":6  "Don't feel right":6  Difficulty concentrating:4  Difficulty remembering:5  Fatigue or low energy:6  Confusion:5  Drowsiness:6  More emotional:6  Irritability:6  Sadness:5  Nervous or Anxious:4  Trouble falling asleep:6   Total number of symptoms: 22/22  Symptom Severity index: 104/132  Worse with physical activity? yes Worse with mental activity? yes Percent improved since injury: 50%    Full pain-free cervical PROM: yes    Tandem gait: - Forward, eyes open: 2 errors - Backward, eyes open: 3 errors - Forward, eyes closed: 6 errors - Backward, eyes closed: Did not complete due to instability  VOMS:   - Baseline symptoms: Mild headache - Smooth pursuits: Dizzy 4/10  - Vertical Saccades: Dizzy 6/10  - Horizontal Saccades: Dizzy 7/10  - Vertical Vestibular-Ocular Reflex: Dizzy 6/10  - Horizontal Vestibular-Ocular Reflex: Dizzy 7/10  - Visual Motion Sensitivity Test: Dizzy 7/10  - Convergence: 15, 15 cm (<5 cm normal)     Electronically signed by:  6/10 D.[name] 10:38 AM 12/05/20

## 2020-12-05 ENCOUNTER — Other Ambulatory Visit: Payer: Self-pay

## 2020-12-05 ENCOUNTER — Ambulatory Visit: Payer: BC Managed Care – PPO | Admitting: Sports Medicine

## 2020-12-05 VITALS — BP 130/86 | HR 90 | Ht 74.0 in | Wt 154.0 lb

## 2020-12-05 DIAGNOSIS — G4700 Insomnia, unspecified: Secondary | ICD-10-CM

## 2020-12-05 DIAGNOSIS — S161XXA Strain of muscle, fascia and tendon at neck level, initial encounter: Secondary | ICD-10-CM

## 2020-12-05 DIAGNOSIS — R270 Ataxia, unspecified: Secondary | ICD-10-CM

## 2020-12-05 DIAGNOSIS — S060X0A Concussion without loss of consciousness, initial encounter: Secondary | ICD-10-CM

## 2020-12-05 DIAGNOSIS — R110 Nausea: Secondary | ICD-10-CM | POA: Diagnosis not present

## 2020-12-05 DIAGNOSIS — G44319 Acute post-traumatic headache, not intractable: Secondary | ICD-10-CM

## 2020-12-05 MED ORDER — TRAZODONE HCL 50 MG PO TABS: 50.0000 mg | ORAL_TABLET | Freq: Every day | ORAL | 0 refills | Status: DC

## 2020-12-05 NOTE — Patient Instructions (Addendum)
Good to see you Out of work for one week, note given  Start melatonin 5mg  nightly Start trazodone 50mg  nightly  MRI scheduled for 12/16/20 at 11:00AM Referral to vestibular therapy placed to [facility] their number is [phone] to call and schedule.  Follow up in 1 week

## 2020-12-11 NOTE — Progress Notes (Signed)
[name] [name] [address] [place] [address] Phone: [phone]  Assessment and Plan:     1. Concussion without loss of consciousness, initial encounter -Acute, mild improvement, subsequent visit - Continued concussion symptoms with mild improvement despite minimal change symptoms severity score - No red flag signs or symptoms at today's visit - Continue with brain MRI scheduled by PCP on 12/16/2020 with CT findings of right frontal low-density potentially representing encephalomalacia versus arachnoid cyst on 11/23/2020 - Out of work for 1 week until reevaluation.  Work note provided  2. Strain of neck muscle, subsequent encounter -Acute, improved  3. Ataxia -Acute, mild improvement - Continued significant vestibular symptoms with abnormal convergence - Vestibular therapy starting this week  4. Nausea without vomiting -Resolved  5. Acute post-traumatic headache, not intractable -Acute, improving - Decrease frequency with improvement in neck pain.  Continue to have headaches with triggers - Tylenol/NSAIDs as needed for pain control  6. Insomnia, unspecified type -Acute, no change - Increase to trazodone 100 mg nightly - Continue melatonin 5 mg nightly - Goal of 7 to 8 hours of sleep nightly   Date of injury was 11/23/20. Symptom severity scores of 22 and 95 today. Original symptom severity scores were 22 and 96. The patient was counseled on the nature of the injury, typical course and potential options for further evaluation and treatment. Discussed the importance of compliance with recommendations. Patient stated understanding of this plan and willingness to comply.  - Recommend light aerobic activity while keeping symptoms <3/10 as long as >48 hours from concussive event - Eliminate screen time as much as possible for first 48 hours from concussive event, then continue limited screen time   - Encouraged to RTC in 1 week for  reassessment or sooner for any concerns or acute changes   Pertinent previous records reviewed include none   Time of visit 30 minutes, which included chart review, physical exam, treatment plan, symptom severity score, VOMS, and tandem gait testing being performed, interpreted, and discussed with patient at today's visit.   Subjective:   I, Moenique Starling Manns, am serving as a Neurosurgeon for Doctor [name]  Chief Complaint: concussion symptoms   HPI:   11/28/20 Patient is a 45 year old male presenting with concussion symptoms since a bunch of wooden board fell on his head at [employer]. Patient states today is very nauseous no vomiting, feels very lethargic and in a fog. Patient felt like yesterday was the worst of the days since the incident.  Patient states that the left side of his neck and head are very tight and sore with radiating pain down his arm with numbness, tingling, an weakness.    12/05/20 Patient states that his headaches are less frequent but more intense when he does have them. Patient has not thrown up for a couple days. Patient states he is still having trouble falling asleep. The neck and arm pain is a lot better, but feels weak all over. Patient having trouble remembering common things like his phone number or pin number, patient also states that he was worn down from just trying to cook dinner and washing dishes.  12/12/20 Patient states he is doing a little better than last week, headaches are going away quicker , short term memory still bad, sleep is still awful hardest thing is getting to sleep even with med's but when he does fall asleep it is a deeper sleep not getting the 8 hours of  sleep maybe 5 hours feels groggy when he wakes.feels like he can do a little more when he is cooking dinner and washing dishes he is able to do more cardio about 6/7 minutes before he felt some sweat coming on but he was "messed" up for about 30 min was dizzy, does music and it is not  clicking in his brain like he used to is supposed to start physical therapy Friday morning    Concussion HPI:  - Injury date: 11/23/20   - Mechanism of injury: boards fell on head   - LOC: no  - Initial evaluation: 11/23/20  - Previous head injuries/concussions: no   - Previous imaging: yes CT of head and neck    - Social history: activities include works in mental health   Hospitalization for head injury? No Diagnosed/treated for headache disorder or migraines? No Diagnosed with learning disability [name]? No Diagnosed with ADD/ADHD? No Diagnose with Depression, anxiety, or other Psychiatric Disorder? Yes anxiety    Current medications:  Current Outpatient Medications  Medication Sig Dispense Refill   ALPRAZolam (XANAX) 0.5 MG tablet Take 0.5 mg by mouth 2 (two) times daily as needed.     fexofenadine (ALLEGRA) 180 MG tablet Take 180 mg by mouth daily.     meloxicam (MOBIC) 15 MG tablet Take 1 tablet (15 mg total) by mouth daily. 30 tablet 3   methocarbamol (ROBAXIN) 500 MG tablet Take 1 tablet (500 mg total) by mouth 2 (two) times daily. 20 tablet 0   Multiple Vitamin (MULTIVITAMIN WITH MINERALS) TABS tablet Take 1 tablet by mouth daily.     ondansetron (ZOFRAN ODT) 4 MG disintegrating tablet Take 1 tablet (4 mg total) by mouth every 8 (eight) hours as needed for nausea or vomiting. 20 tablet 0   ondansetron (ZOFRAN) 4 MG tablet Take 1 tablet (4 mg total) by mouth every 8 (eight) hours as needed. 20 tablet 0   sertraline (ZOLOFT) 100 MG tablet Take 150 mg by mouth daily.     VITAMIN D PO Take by mouth.     No current facility-administered medications for this visit.      Objective:     Vitals:   12/12/20 1048  BP: 130/90  Pulse: 86  SpO2: 95%  Weight: 158 lb (71.7 kg)  Height: 6\' 2"  (1.88 m)      Body mass index is 20.29 kg/m.    Physical Exam:     General: Well-appearing, cooperative, sitting comfortably in no acute distress.  Psychiatric: Mood and affect are  appropriate.     Today's Symptom Severity Score:  Scores: 0-6  Headache:4 "Pressure in head":4  Neck Pain:1  Nausea or vomiting:3  Dizziness:3  Blurred vision:2  Balance problems:3  Sensitivity to light:3  Sensitivity to noise:2  Feeling slowed down:6  Feeling like "in a fog":5  "Don't feel right":5  Difficulty concentrating:4  Difficulty remembering:5  Fatigue or low energy:6  Confusion:5  Drowsiness:6  More emotional:6  Irritability:6  Sadness:5  Nervous or Anxious:5  Trouble falling asleep:6   Total number of symptoms: 22/22  Symptom Severity index: 95/132  Worse with physical activity? yes Worse with mental activity? yes Percent improved since injury: 65%    Full pain-free cervical PROM: yes    Tandem gait: - Forward, eyes open: 0 errors - Backward, eyes open: 2 errors - Forward, eyes closed: 4 errors - Backward, eyes closed: 7 errors  VOMS:   - Baseline symptoms: Mild headache - Smooth pursuits: Dizziness and headache -  Vertical Saccades: Headache - Horizontal Saccades: Headache - Vertical Vestibular-Ocular Reflex: Dizziness - Horizontal Vestibular-Ocular Reflex: Dizziness - Visual Motion Sensitivity Test: Dizziness and headache - Convergence: 15, 15 cm (<5 cm normal)     Electronically signed by:  [name] [name] 11:23 AM 12/12/20

## 2020-12-12 ENCOUNTER — Other Ambulatory Visit: Payer: Self-pay

## 2020-12-12 ENCOUNTER — Ambulatory Visit (INDEPENDENT_AMBULATORY_CARE_PROVIDER_SITE_OTHER): Payer: BC Managed Care – PPO | Admitting: Sports Medicine

## 2020-12-12 VITALS — BP 130/90 | HR 86 | Ht 74.0 in | Wt 158.0 lb

## 2020-12-12 DIAGNOSIS — G44319 Acute post-traumatic headache, not intractable: Secondary | ICD-10-CM

## 2020-12-12 DIAGNOSIS — G4700 Insomnia, unspecified: Secondary | ICD-10-CM

## 2020-12-12 DIAGNOSIS — R270 Ataxia, unspecified: Secondary | ICD-10-CM | POA: Diagnosis not present

## 2020-12-12 DIAGNOSIS — R110 Nausea: Secondary | ICD-10-CM | POA: Diagnosis not present

## 2020-12-12 DIAGNOSIS — S060X0A Concussion without loss of consciousness, initial encounter: Secondary | ICD-10-CM

## 2020-12-12 DIAGNOSIS — S161XXD Strain of muscle, fascia and tendon at neck level, subsequent encounter: Secondary | ICD-10-CM | POA: Diagnosis not present

## 2020-12-12 MED ORDER — TRAZODONE HCL 100 MG PO TABS: 100.0000 mg | ORAL_TABLET | Freq: Every day | ORAL | 0 refills | Status: DC

## 2020-12-12 NOTE — Patient Instructions (Addendum)
Good to see you  Increase Trazadone 100 mg nightly  Continue melatonin 5 mg nightly  Continue vestibular therapy Continue with scheduled MRI 12/16/20

## 2020-12-15 DIAGNOSIS — M2569 Stiffness of other specified joint, not elsewhere classified: Secondary | ICD-10-CM | POA: Diagnosis not present

## 2020-12-15 DIAGNOSIS — R42 Dizziness and giddiness: Secondary | ICD-10-CM | POA: Diagnosis not present

## 2020-12-15 DIAGNOSIS — M542 Cervicalgia: Secondary | ICD-10-CM | POA: Diagnosis not present

## 2020-12-15 DIAGNOSIS — R2681 Unsteadiness on feet: Secondary | ICD-10-CM | POA: Diagnosis not present

## 2020-12-16 ENCOUNTER — Ambulatory Visit
Admission: RE | Admit: 2020-12-16 | Discharge: 2020-12-16 | Disposition: A | Discharge status: Home / Self Care | Payer: BC Managed Care – PPO | Source: Ambulatory Visit | Attending: Family Medicine | Admitting: Family Medicine

## 2020-12-16 DIAGNOSIS — S0990XA Unspecified injury of head, initial encounter: Secondary | ICD-10-CM | POA: Diagnosis not present

## 2020-12-16 DIAGNOSIS — J341 Cyst and mucocele of nose and nasal sinus: Secondary | ICD-10-CM | POA: Diagnosis not present

## 2020-12-16 DIAGNOSIS — R9089 Other abnormal findings on diagnostic imaging of central nervous system: Secondary | ICD-10-CM

## 2020-12-16 DIAGNOSIS — G939 Disorder of brain, unspecified: Secondary | ICD-10-CM | POA: Diagnosis not present

## 2020-12-16 DIAGNOSIS — R42 Dizziness and giddiness: Secondary | ICD-10-CM | POA: Diagnosis not present

## 2020-12-16 IMAGING — MR MR HEAD WO/W CM
13 series · 48 of 48 positions shown · IV contrast (multihance)
Comparison: CT head [DATE].

CLINICAL DATA: 45-year-old male with dizziness and head injury last
month. Abnormal right frontal lobe appearance on CT.

EXAM:
MRI HEAD WITHOUT AND WITH CONTRAST
TECHNIQUE: Multiplanar, multiecho pulse sequences of the brain and surrounding
structures were obtained without and with intravenous contrast.
CONTRAST:  20mL MULTIHANCE GADOBENATE DIMEGLUMINE 529 MG/ML IV SOLN

[Series 2: T1 · sagittal · 5.0mm · 0.45mm/px · 2 of 25 slices shown]
[im 1/25]
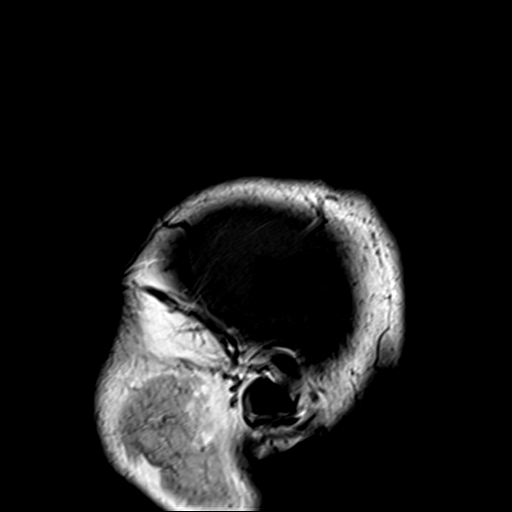
[im 25/25]
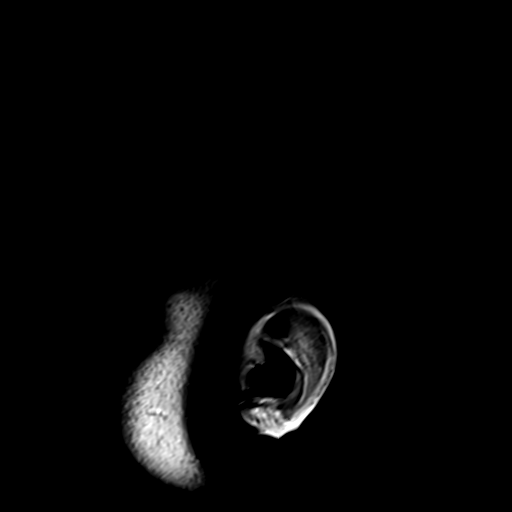

[Series 3: ax ep2d_diff_3 · axial · 3.0mm · 1.80mm/px · z∈[-53,+113]mm · 6 of 113 slices shown]
[im 1/113]
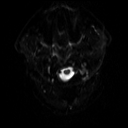
[im 23/113]
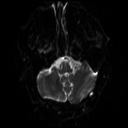
[im 45/113]
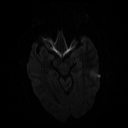
[im 68/113]
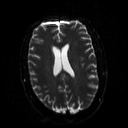
[im 90/113]
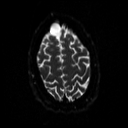
[im 113/113]
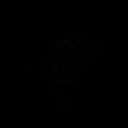

[Series 4: ax ep2d_diff_3_adc · axial · 3.0mm · 1.80mm/px · z∈[-53,+113]mm · 3 of 56 slices shown]
[im 1/56]
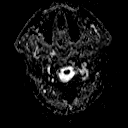
[im 28/56]
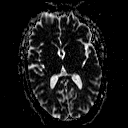
[im 56/56]
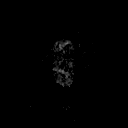

[Series 5: cor ep2d_diff · coronal · 5.0mm · 1.77mm/px · 3 of 61 slices shown]
[im 1/61]
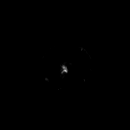
[im 31/61]
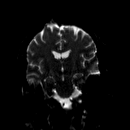
[im 61/61]
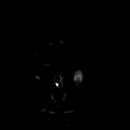

[Series 6: cor ep2d_diff_adc · coronal · 5.0mm · 1.77mm/px · 2 of 31 slices shown]
[im 1/31]
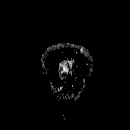
[im 31/31]
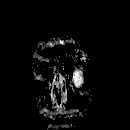

[Series 8: swi_images · axial · 2.0mm · 0.98mm/px · z∈[-58,+115]mm · 5 of 88 slices shown]
[im 1/88]
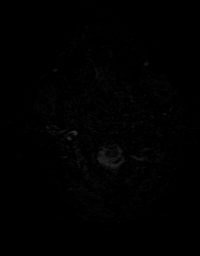
[im 22/88]
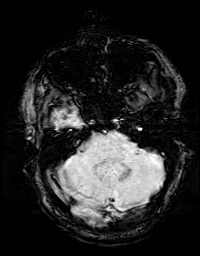
[im 44/88]
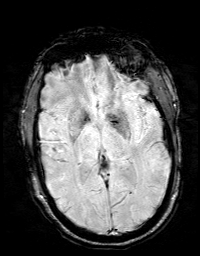
[im 66/88]
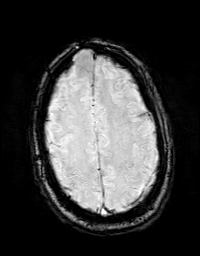
[im 88/88]
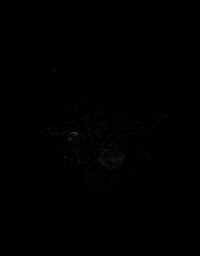

[Series 9: FLAIR · axial · 3.0mm · 0.43mm/px · z∈[-49,+103]mm · 2 of 40 slices shown]
[im 1/40]
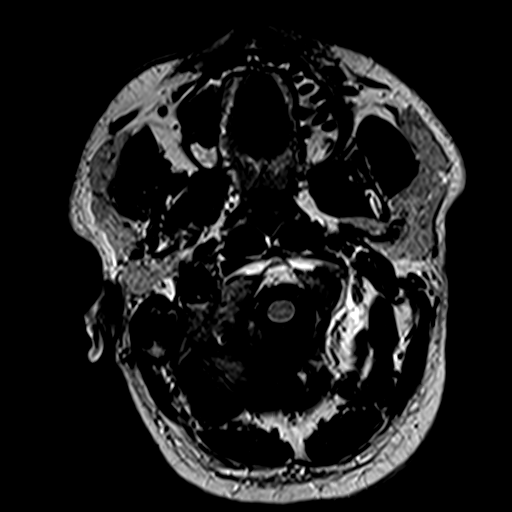
[im 40/40]
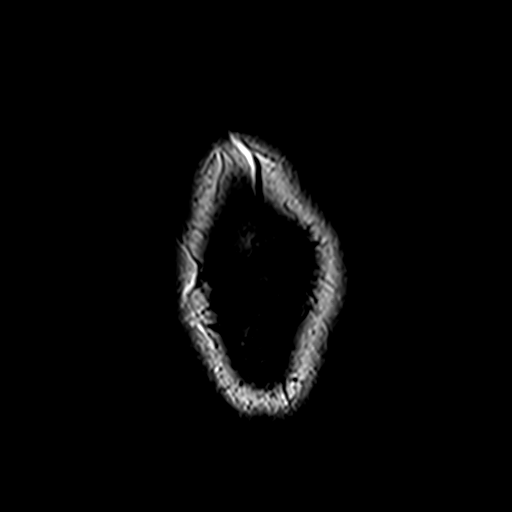

[Series 10: T2 · axial · 5.0mm · 0.65mm/px · z∈[-55,+112]mm · 2 of 29 slices shown (1 of 2)]
[im 1/29]
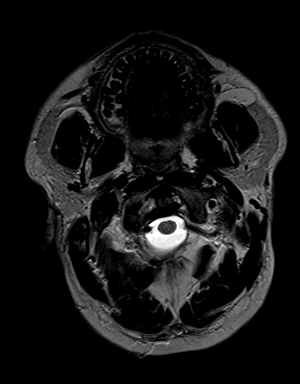
[im 29/29]
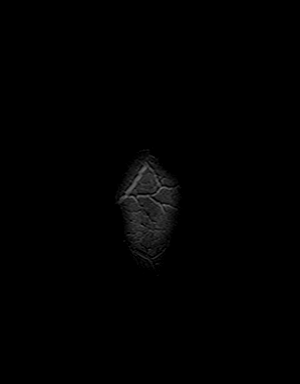

[Series 11: t1_mpr_tra · axial · 1.0mm · 0.72mm/px · z∈[-55,+110]mm · 9 of 160 slices shown]
[im 1/160]
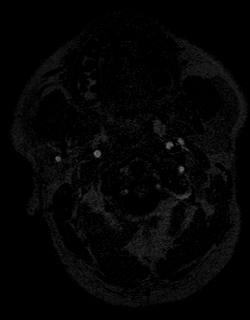
[im 20/160]
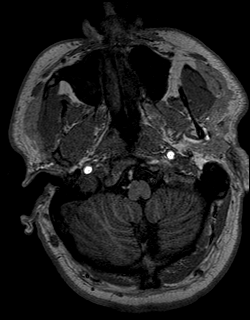
[im 40/160]
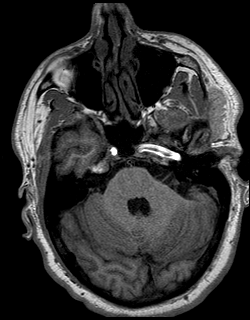
[im 60/160]
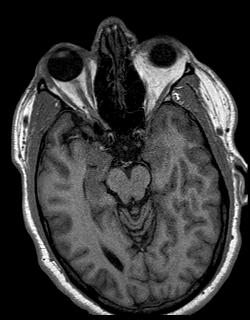
[im 80/160]
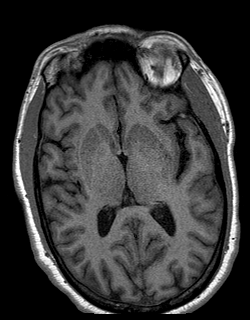
[im 100/160]
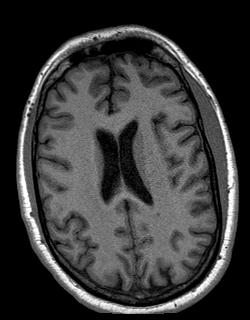
[im 120/160]
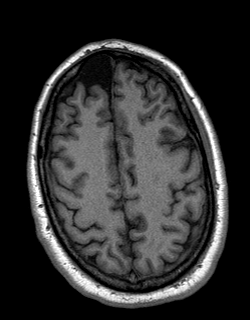
[im 140/160]
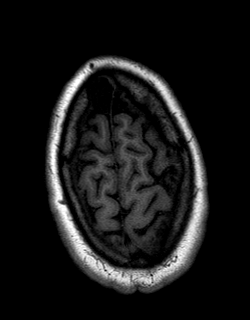
[im 160/160]
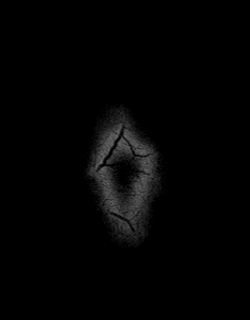

[Series 12: T2 · coronal · 5.0mm · 0.43mm/px · 2 of 30 slices shown (2 of 2)]
[im 1/30]
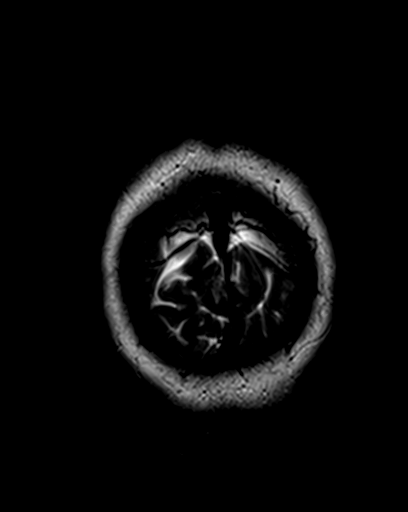
[im 30/30]
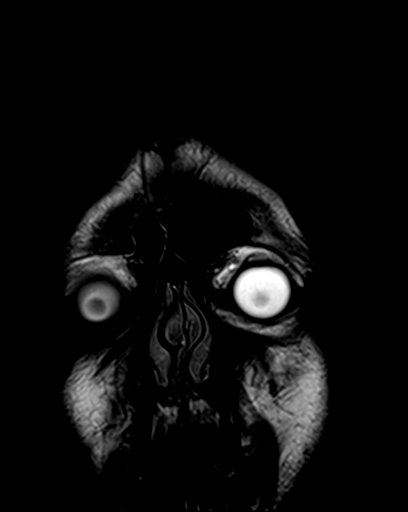

[Series 13: post t1_mpr_tra · axial · 1.0mm · 0.72mm/px · z∈[-55,+110]mm · 9 of 160 slices shown]
[im 1/160]
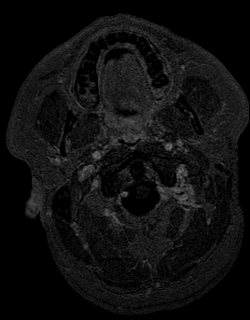
[im 20/160]
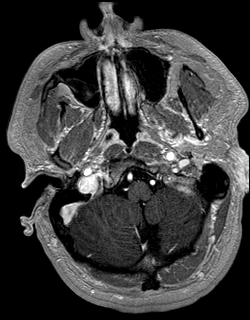
[im 40/160]
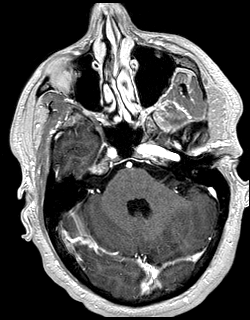
[im 60/160]
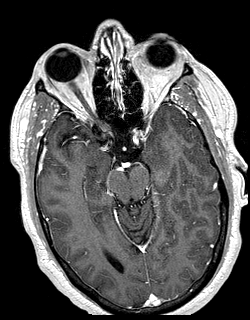
[im 80/160]
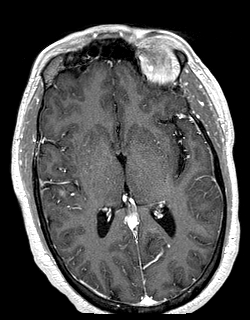
[im 100/160]
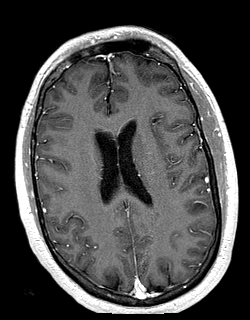
[im 120/160]
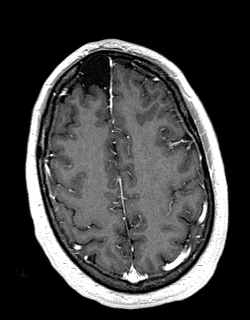
[im 140/160]
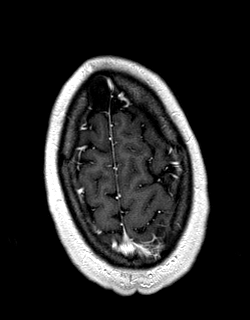
[im 160/160]
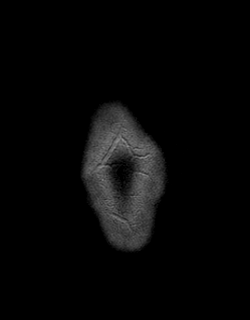

[Series 14: T1 post-contrast · coronal · 5.0mm · 0.72mm/px · 2 of 33 slices shown]
[im 1/33]
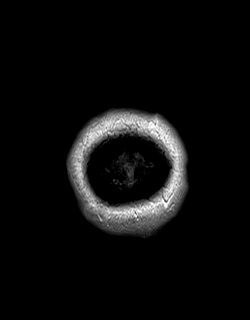
[im 33/33]
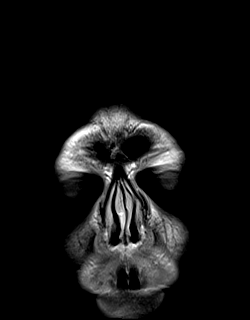

[Series 15: t1_se_sag post · sagittal · 5.0mm · 0.45mm/px · 1 of 25 slices shown]
[im 1/25]
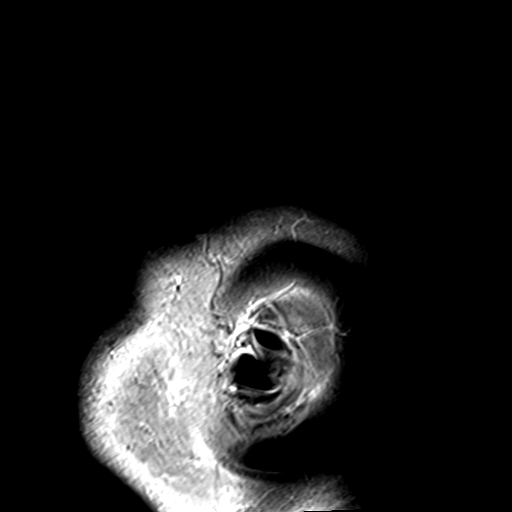

[48 of 48 positions shown; findings below may reference images not displayed]

FINDINGS: Brain: Circumscribed CSF isointense extra-axial collection along the
right anterior superior frontal convexity (series 2, image 11)
encompasses about 51 by 21 by 17 mm (AP by transverse by CC) and
corresponds to the CT finding last month. As seen on that exam there
is smooth scalloping of the inner table of the skull there. A single
vascular septation runs through the lesion (series 13, image 27)
which otherwise appears simple and without enhancement. No
significant mass effect on the adjacent frontal lobe. No cerebral
edema or underlying encephalomalacia.

No superimposed restricted diffusion to suggest acute infarction. No
midline shift, ventriculomegaly, or acute intracranial hemorrhage.
Cervicomedullary junction within normal limits. Partially empty
sella.

Vascular: Major intracranial vascular flow voids are preserved. The
major dural venous sinuses are enhancing and appear to be patent.

Skull and upper cervical spine: Scaphocephaly. Smoothly scalloped
left anterior frontal bone. Visualized bone marrow signal is within
normal limits. Negative visible cervical spine.

Sinuses/Orbits: Negative orbits. Paranasal sinuses and mastoids are
stable and well aerated. Small maxillary retention cysts.

Other: Visible internal auditory structures appear normal. Negative
visible scalp and face.
IMPRESSION: 1. Benign and inconsequential appearing right frontal convexity
arachnoid cyst, corresponding to the CT finding. This is most likely
congenital and clinically silent.

2. Elsewhere negative MRI appearance of the brain aside from
Partially empty sella, which is often a normal anatomic variant but
can be associated with idiopathic intracranial hypertension
(pseudotumor cerebri).

## 2020-12-16 MED ORDER — GADOBENATE DIMEGLUMINE 529 MG/ML IV SOLN
20.0000 mL | Freq: Once | INTRAVENOUS | Status: AC | PRN
  Administered 2020-12-16: 20 mL via INTRAVENOUS

## 2020-12-19 DIAGNOSIS — M542 Cervicalgia: Secondary | ICD-10-CM | POA: Diagnosis not present

## 2020-12-19 DIAGNOSIS — R42 Dizziness and giddiness: Secondary | ICD-10-CM | POA: Diagnosis not present

## 2020-12-19 DIAGNOSIS — M2569 Stiffness of other specified joint, not elsewhere classified: Secondary | ICD-10-CM | POA: Diagnosis not present

## 2020-12-19 DIAGNOSIS — R2681 Unsteadiness on feet: Secondary | ICD-10-CM | POA: Diagnosis not present

## 2020-12-19 NOTE — Progress Notes (Signed)
[facility] [address] [place] [address] [place] [address] Phone: [phone]  Assessment and Plan:     1. Concussion without loss of consciousness, initial encounter -Acute, mild improvement, subsequent visit - Continue concussion symptoms with mild improvement until exacerbating symptoms with an MRI - No red flag symptoms at today's visit - MRI largely unremarkable with incidental findings and normal anatomical variants.  Performed on 12/16/2020 - Out of work for 1 week until reevaluation.  Work note provided  2. Ataxia -Acute, mild improvement - Continued vestibular symptoms with abnormal convergence - Continue vestibular therapy  3. Acute post-traumatic headache, not intractable -Acute, worsening - More frequent and more intense headaches following MRI and starting vestibular therapy. - Advised to avoid triggers as much as possible and use Tylenol/NSAIDs as needed for relief  4. Insomnia, unspecified type -Acute, no change - Increase to trazodone 150 mg nightly.  If this is ineffective for 2 to 3 days, can increase to trazodone 200 mg nightly - Continue melatonin 5 mg nightly - Goal of 7 to 8 hours of sleep nightly   Date of injury was 11/23/2020. Symptom severity scores of 22 and 91 today. Original symptom severity scores were 22 and 96. The patient was counseled on the nature of the injury, typical course and potential options for further evaluation and treatment. Discussed the importance of compliance with recommendations. Patient stated understanding of this plan and willingness to comply.  Recommendations:  -  Complete mental and physical rest for 48 hours after concussive event - Recommend light aerobic activity while keeping symptoms less than 3/10 - Stop mental or physical activities that cause symptoms to worsen greater than 3/10, and wait 24 hours before attempting them again - Eliminate screen time as much as possible for first 48  hours after concussive event, then continue limited screen time (recommend less than 2 hours per day)   - Encouraged to RTC in 2 weeks for reassessment or sooner for any concerns or acute changes   Pertinent previous records reviewed include MRI brain 12/16/2020   Time of visit 36 minutes, which included chart review, physical exam, treatment plan, symptom severity score, VOMS, and tandem gait testing being performed, interpreted, and discussed with patient at today's visit.   Subjective:   I, [facility] Chang, am serving as a Neurosurgeon for Doctor [name]  Chief Complaint: concussion symptoms   HPI:  11/28/20 Patient is a 45 year old male presenting with concussion symptoms since a bunch of wooden board fell on his head at [employer]. Patient states today is very nauseous no vomiting, feels very lethargic and in a fog. Patient felt like yesterday was the worst of the days since the incident.  Patient states that the left side of his neck and head are very tight and sore with radiating pain down his arm with numbness, tingling, an weakness.    12/05/20 Patient states that his headaches are less frequent but more intense when he does have them. Patient has not thrown up for a couple days. Patient states he is still having trouble falling asleep. The neck and arm pain is a lot better, but feels weak all over. Patient having trouble remembering common things like his phone number or pin number, patient also states that he was worn down from just trying to cook dinner and washing dishes.   12/12/20 Patient states he is doing a little better than last week, headaches are going away quicker , short term  memory still bad, sleep is still awful hardest thing is getting to sleep even with med's but when he does fall asleep it is a deeper sleep not getting the 8 hours of sleep maybe 5 hours feels groggy when he wakes.feels like he can do a little more when he is cooking dinner and washing dishes he is  able to do more cardio about 6/7 minutes before he felt some sweat coming on but he was "messed" up for about 30 min was dizzy, does music and it is not clicking in his brain like he used to is supposed to start physical therapy Friday morning   12/20/2020 Patient states that he is doing alright has been a rough week got sick in PT,went to second pt yesterday and did not get sick. MRI on Saturday and that was not a good experience has had the worst constant headaches since Saturday ,nothing has really helped. Still feels overall better than when accident  happened . nausea hasn't been as bad but it is still there, still short term memory issues     Concussion HPI:  - Injury date: 11/23/20   - Mechanism of injury: boards fell on head   - LOC: no  - Initial evaluation: 11/23/20  - Previous head injuries/concussions: no   - Previous imaging: yes CT of head and neck    - Social history: activities include works in mental health   Hospitalization for head injury? No Diagnosed/treated for headache disorder or migraines? No Diagnosed with learning disability [name]? No Diagnosed with ADD/ADHD? No Diagnose with Depression, anxiety, or other Psychiatric Disorder? Yes anxiety    Current medications:  Current Outpatient Medications  Medication Sig Dispense Refill   ALPRAZolam (XANAX) 0.5 MG tablet Take 0.5 mg by mouth 2 (two) times daily as needed.     fexofenadine (ALLEGRA) 180 MG tablet Take 180 mg by mouth daily.     meloxicam (MOBIC) 15 MG tablet Take 1 tablet (15 mg total) by mouth daily. 30 tablet 3   methocarbamol (ROBAXIN) 500 MG tablet Take 1 tablet (500 mg total) by mouth 2 (two) times daily. 20 tablet 0   Multiple Vitamin (MULTIVITAMIN WITH MINERALS) TABS tablet Take 1 tablet by mouth daily.     ondansetron (ZOFRAN ODT) 4 MG disintegrating tablet Take 1 tablet (4 mg total) by mouth every 8 (eight) hours as needed for nausea or vomiting. 20 tablet 0   ondansetron (ZOFRAN) 4 MG tablet  Take 1 tablet (4 mg total) by mouth every 8 (eight) hours as needed. 20 tablet 0   sertraline (ZOLOFT) 100 MG tablet Take 150 mg by mouth daily.     traZODone (DESYREL) 100 MG tablet Take 1 tablet (100 mg total) by mouth at bedtime. 14 tablet 0   VITAMIN D PO Take by mouth.     No current facility-administered medications for this visit.      Objective:     Vitals:   12/20/20 1109  BP: 130/80  Weight: 158 lb (71.7 kg)  Height: 6\' 2"  (1.88 m)      Body mass index is 20.29 kg/m.    Physical Exam:     General: Well-appearing, cooperative, sitting comfortably in no acute distress.  Psychiatric: Mood and affect are appropriate.     Today's Symptom Severity Score:  Scores: 0-6  Headache:6 "Pressure in head":5  Neck Pain:1  Nausea or vomiting:4  Dizziness:4  Blurred vision:3  Balance problems:3  Sensitivity to light:2  Sensitivity to noise:2  Feeling  slowed down:5  Feeling like in a fog:4  Dont feel right:4  Difficulty concentrating:4  Difficulty remembering:4  Fatigue or low energy:5  Confusion:4  Drowsiness:6  More emotional:5  Irritability:4  Sadness:5  Nervous or Anxious:5  Trouble falling asleep:6   Total number of symptoms: 22/22  Symptom Severity index: 91/132  Worse with physical activity? yes Worse with mental activity? yes Percent improved since injury: 70%    Full pain-free cervical PROM: yes    Tandem gait: - Forward, eyes open: 0 errors - Backward, eyes open: 1 errors - Forward, eyes closed: 2 errors - Backward, eyes closed: 3 errors  VOMS:   - Baseline symptoms: Mild headache - Vertical Vestibular-Ocular Reflex: Dizzy 3/10  - Horizontal Vestibular-Ocular Reflex: Dizzy 3/10  - Smooth pursuits: Dizzy 2/10  - Vertical Saccades: Dizzy 4/10  - Horizontal Saccades: Dizzy 5/10  - Visual Motion Sensitivity Test: Dizzy 6/10  - Convergence: 9, 9 cm (<5 cm normal)     Electronically signed by:  [facility] 11:34 AM 12/20/20

## 2020-12-20 ENCOUNTER — Ambulatory Visit: Payer: BC Managed Care – PPO | Admitting: Sports Medicine

## 2020-12-20 ENCOUNTER — Other Ambulatory Visit: Payer: Self-pay

## 2020-12-20 VITALS — BP 130/80 | Ht 74.0 in | Wt 158.0 lb

## 2020-12-20 DIAGNOSIS — S060X0A Concussion without loss of consciousness, initial encounter: Secondary | ICD-10-CM | POA: Diagnosis not present

## 2020-12-20 DIAGNOSIS — G44319 Acute post-traumatic headache, not intractable: Secondary | ICD-10-CM | POA: Diagnosis not present

## 2020-12-20 DIAGNOSIS — R270 Ataxia, unspecified: Secondary | ICD-10-CM

## 2020-12-20 DIAGNOSIS — G4700 Insomnia, unspecified: Secondary | ICD-10-CM | POA: Diagnosis not present

## 2020-12-20 NOTE — Patient Instructions (Addendum)
Good to see you  Increase trazodone to 150 mg nightly if that is ineffective increase to 200 mg nightly  Continue with vestibular PT 1 week follow up

## 2020-12-21 ENCOUNTER — Encounter: Payer: Self-pay | Admitting: Sports Medicine

## 2020-12-21 DIAGNOSIS — M2569 Stiffness of other specified joint, not elsewhere classified: Secondary | ICD-10-CM | POA: Diagnosis not present

## 2020-12-21 DIAGNOSIS — R42 Dizziness and giddiness: Secondary | ICD-10-CM | POA: Diagnosis not present

## 2020-12-21 DIAGNOSIS — M542 Cervicalgia: Secondary | ICD-10-CM | POA: Diagnosis not present

## 2020-12-21 DIAGNOSIS — R2681 Unsteadiness on feet: Secondary | ICD-10-CM | POA: Diagnosis not present

## 2020-12-22 MED ORDER — ONDANSETRON 4 MG PO TBDP
4.0000 mg | ORAL_TABLET | Freq: Three times a day (TID) | ORAL | 0 refills | Status: DC | PRN
Start: 2020-12-22 — End: 2021-01-30

## 2020-12-25 DIAGNOSIS — R42 Dizziness and giddiness: Secondary | ICD-10-CM | POA: Diagnosis not present

## 2020-12-25 DIAGNOSIS — M2569 Stiffness of other specified joint, not elsewhere classified: Secondary | ICD-10-CM | POA: Diagnosis not present

## 2020-12-25 DIAGNOSIS — M542 Cervicalgia: Secondary | ICD-10-CM | POA: Diagnosis not present

## 2020-12-25 DIAGNOSIS — R2681 Unsteadiness on feet: Secondary | ICD-10-CM | POA: Diagnosis not present

## 2020-12-25 NOTE — Progress Notes (Signed)
[facility] [address] [place] [address] Phone: [phone]  Assessment and Plan:     1. Concussion without loss of consciousness, initial encounter -Acute, improving, subsequent visit - Improvement in concussion symptoms with patient getting significantly improved sleep - Can restart work on 12/28 for 4 hours maximum taking breaks as needed.  Work note provided  2. Ataxia -Acute, improving - Continue vestibular therapy  3. Acute post-traumatic headache, not intractable -Acute, improving - Tylenol/NSAIDs as needed for pain relief  4. Insomnia, unspecified type -Acute, improving - Continue trazodone 150 mg nightly as this seems to be the lowest effective dose with patient now getting 7 to 8 hours of sleep nightly   Date of injury was 11/23/2020. Symptom severity scores of 22 and 77 today. Original symptom severity scores were 22 and 96. The patient was counseled on the nature of the injury, typical course and potential options for further evaluation and treatment. Discussed the importance of compliance with recommendations. Patient stated understanding of this plan and willingness to comply.  Recommendations:  -  Complete mental and physical rest for 48 hours after concussive event - Recommend light aerobic activity while keeping symptoms less than 3/10 - Stop mental or physical activities that cause symptoms to worsen greater than 3/10, and wait 24 hours before attempting them again - Eliminate screen time as much as possible for first 48 hours after concussive event, then continue limited screen time (recommend less than 2 hours per day)   - Encouraged to RTC in 2 weeks for reassessment or sooner for any concerns or acute changes   Pertinent previous records reviewed include none   Time of visit 37 minutes, which included chart review, physical exam, treatment plan, symptom severity score, VOMS, and tandem gait testing being  performed, interpreted, and discussed with patient at today's visit.   Subjective:   I, [facility], am serving as a Neurosurgeon for Doctor [name]  Chief Complaint: concussion symptoms  HPI:   11/28/20 Patient is a 45 year old male presenting with concussion symptoms since a bunch of wooden board fell on his head at [employer]. Patient states today is very nauseous no vomiting, feels very lethargic and in a fog. Patient felt like yesterday was the worst of the days since the incident.  Patient states that the left side of his neck and head are very tight and sore with radiating pain down his arm with numbness, tingling, an weakness.    12/05/20 Patient states that his headaches are less frequent but more intense when he does have them. Patient has not thrown up for a couple days. Patient states he is still having trouble falling asleep. The neck and arm pain is a lot better, but feels weak all over. Patient having trouble remembering common things like his phone number or pin number, patient also states that he was worn down from just trying to cook dinner and washing dishes.   12/12/20 Patient states he is doing a little better than last week, headaches are going away quicker , short term memory still bad, sleep is still awful hardest thing is getting to sleep even with med's but when he does fall asleep it is a deeper sleep not getting the 8 hours of sleep maybe 5 hours feels groggy when he wakes.feels like he can do a little more when he is cooking dinner and washing dishes he is able to do more cardio about 6/7 minutes before  he felt some sweat coming on but he was "messed" up for about 30 min was dizzy, does music and it is not clicking in his brain like he used to is supposed to start physical therapy Friday morning    12/20/2020 Patient states that he is doing alright has been a rough week got sick in PT,went to second pt yesterday and did not get sick. MRI on Saturday and that was  not a good experience has had the worst constant headaches since Saturday ,nothing has really helped. Still feels overall better than when accident  happened . nausea hasn't been as bad but it is still there, still short term memory issues    12/26/2020 Patient states that he is doing a lot better since he is getting some sleep with the increase to trazodone, hasn't been as symptomatic when he does get symptoms they subside quickly       Concussion HPI:  - Injury date: 11/23/20   - Mechanism of injury: boards fell on head   - LOC: no  - Initial evaluation: 11/23/20  - Previous head injuries/concussions: no   - Previous imaging: yes CT of head and neck    - Social history: activities include works in mental health   Hospitalization for head injury? No Diagnosed/treated for headache disorder or migraines? No Diagnosed with learning disability [place]? No Diagnosed with ADD/ADHD? No Diagnose with Depression, anxiety, or other Psychiatric Disorder? Yes anxiety      Current medications:  Current Outpatient Medications  Medication Sig Dispense Refill   ALPRAZolam (XANAX) 0.5 MG tablet Take 0.5 mg by mouth 2 (two) times daily as needed.     fexofenadine (ALLEGRA) 180 MG tablet Take 180 mg by mouth daily.     meloxicam (MOBIC) 15 MG tablet Take 1 tablet (15 mg total) by mouth daily. 30 tablet 3   methocarbamol (ROBAXIN) 500 MG tablet Take 1 tablet (500 mg total) by mouth 2 (two) times daily. 20 tablet 0   Multiple Vitamin (MULTIVITAMIN WITH MINERALS) TABS tablet Take 1 tablet by mouth daily.     ondansetron (ZOFRAN ODT) 4 MG disintegrating tablet Take 1 tablet (4 mg total) by mouth every 8 (eight) hours as needed for nausea or vomiting. 20 tablet 0   ondansetron (ZOFRAN) 4 MG tablet Take 1 tablet (4 mg total) by mouth every 8 (eight) hours as needed. 20 tablet 0   sertraline (ZOLOFT) 100 MG tablet Take 150 mg by mouth daily.     traZODone (DESYREL) 100 MG tablet Take 1 tablet (100 mg  total) by mouth at bedtime. 14 tablet 0   traZODone (DESYREL) 150 MG tablet Take 1 tablet (150 mg total) by mouth at bedtime as needed for sleep. 30 tablet 0   VITAMIN D PO Take by mouth.     No current facility-administered medications for this visit.      Objective:     Vitals:   12/26/20 1001  BP: 130/70  Pulse: 83  SpO2: 97%  Weight: 247 lb (112 kg)  Height: 6\' 2"  (1.88 m)      Body mass index is 31.71 kg/m.    Physical Exam:     General: Well-appearing, cooperative, sitting comfortably in no acute distress.  Psychiatric: Mood and affect are appropriate.     Today's Symptom Severity Score:  Scores: 0-6  Headache:2 "Pressure in head":2  Neck Pain:1  Nausea or vomiting:2  Dizziness:2  Blurred vision:3  Balance problems:2  Sensitivity to light:2  Sensitivity to  noise:3  Feeling slowed down:5  Feeling like in a fog:5  Dont feel right:4  Difficulty concentrating:5  Difficulty remembering:4  Fatigue or low energy:5  Confusion:3  Drowsiness:4  More emotional:5  Irritability:5  Sadness:5  Nervous or Anxious:5  Trouble falling asleep:3   Total number of symptoms: 22/22  Symptom Severity index: 77/132  Worse with physical activity? Yes  Worse with mental activity? yes Percent improved since injury: 65-70%    Full pain-free cervical PROM: yes    Tandem gait: - Forward, eyes open: 1 errors - Backward, eyes open: 1 errors - Forward, eyes closed: 4 errors - Backward, eyes closed: 5 errors  VOMS:   - Baseline symptoms: 0 - Vertical Vestibular-Ocular Reflex: Dizzy 2/10  - Horizontal Vestibular-Ocular Reflex: Dizzy 2/10  - Smooth pursuits: Dizzy 2/10  - Vertical Saccades: Dizzy 2/10  - Horizontal Saccades: Dizzy 2/10  - Visual Motion Sensitivity Test:  Dizzy 2/10  - Convergence: 9, 9 cm (<5 cm normal)     Electronically signed by:  [facility] 10:28 AM 12/26/20

## 2020-12-26 ENCOUNTER — Other Ambulatory Visit: Payer: Self-pay

## 2020-12-26 ENCOUNTER — Ambulatory Visit (INDEPENDENT_AMBULATORY_CARE_PROVIDER_SITE_OTHER): Payer: BC Managed Care – PPO | Admitting: Sports Medicine

## 2020-12-26 VITALS — BP 130/70 | HR 83 | Ht 74.0 in | Wt 247.0 lb

## 2020-12-26 DIAGNOSIS — R270 Ataxia, unspecified: Secondary | ICD-10-CM

## 2020-12-26 DIAGNOSIS — S060X0A Concussion without loss of consciousness, initial encounter: Secondary | ICD-10-CM

## 2020-12-26 DIAGNOSIS — G4700 Insomnia, unspecified: Secondary | ICD-10-CM | POA: Diagnosis not present

## 2020-12-26 DIAGNOSIS — G44319 Acute post-traumatic headache, not intractable: Secondary | ICD-10-CM | POA: Diagnosis not present

## 2020-12-26 MED ORDER — TRAZODONE HCL 150 MG PO TABS: 150.0000 mg | ORAL_TABLET | Freq: Every evening | ORAL | 0 refills | Status: DC | PRN

## 2020-12-26 NOTE — Patient Instructions (Addendum)
Good to see you Work note provided  Trazodone 150 mg 1 tablet nightly  2 week follow up

## 2020-12-27 ENCOUNTER — Ambulatory Visit: Payer: BC Managed Care – PPO | Admitting: Sports Medicine

## 2020-12-28 DIAGNOSIS — M542 Cervicalgia: Secondary | ICD-10-CM | POA: Diagnosis not present

## 2020-12-28 DIAGNOSIS — R2681 Unsteadiness on feet: Secondary | ICD-10-CM | POA: Diagnosis not present

## 2020-12-28 DIAGNOSIS — R42 Dizziness and giddiness: Secondary | ICD-10-CM | POA: Diagnosis not present

## 2020-12-28 DIAGNOSIS — M2569 Stiffness of other specified joint, not elsewhere classified: Secondary | ICD-10-CM | POA: Diagnosis not present

## 2021-01-03 DIAGNOSIS — R2681 Unsteadiness on feet: Secondary | ICD-10-CM | POA: Diagnosis not present

## 2021-01-03 DIAGNOSIS — M542 Cervicalgia: Secondary | ICD-10-CM | POA: Diagnosis not present

## 2021-01-03 DIAGNOSIS — R42 Dizziness and giddiness: Secondary | ICD-10-CM | POA: Diagnosis not present

## 2021-01-03 DIAGNOSIS — M2569 Stiffness of other specified joint, not elsewhere classified: Secondary | ICD-10-CM | POA: Diagnosis not present

## 2021-01-05 NOTE — Progress Notes (Signed)
[name] [name] [address] [place] [address] Phone: [phone]  Assessment and Plan:     1. Concussion without loss of consciousness, initial encounter - Sub-acute, mild improvement, subsequent visit - Minimal improvement in symptoms with other symptoms worsening after restarting 4-hour days of work.  Patient admits that he had to work longer than 4-hour days on multiple occasions to try to catch up - Can work from home for 2 to 4 hours maximum taking rest breaks as needed.  Work note provided for 1 week  2. Ataxia - Sub-acute, improving - Continue vestibular therapy  3. Brain fog - Sub-acute, no change -Start amantadine 100 mg twice daily.  1 month supply sent in.  Goal of 2 months treatment  4. Insomnia, unspecified type -Acute, improving - Improved sleep when using trazodone 150 mg nightly - Refill trazodone 150 mg sent to pharmacy   Date of injury was 11/23/2020. Symptom severity scores of 19 and 58 today. Original symptom severity scores were 22 and 96. The patient was counseled on the nature of the injury, typical course and potential options for further evaluation and treatment. Discussed the importance of compliance with recommendations. Patient stated understanding of this plan and willingness to comply.  Recommendations:  -  Complete mental and physical rest for 48 hours after concussive event - Recommend light aerobic activity while keeping symptoms less than 3/10 - Stop mental or physical activities that cause symptoms to worsen greater than 3/10, and wait 24 hours before attempting them again - Eliminate screen time as much as possible for first 48 hours after concussive event, then continue limited screen time (recommend less than 2 hours per day)   - Encouraged to RTC in 1 week for reassessment or sooner for any concerns or acute changes   Pertinent previous records reviewed include none   Time of visit 31 minutes,  which included chart review, physical exam, treatment plan, symptom severity score, VOMS, and tandem gait testing being performed, interpreted, and discussed with patient at today's visit.   Subjective:   I, Moenique Starling Manns, am serving as a Neurosurgeon for Doctor [name]  Chief Complaint: concussion symptoms   HPI:  11/28/20 Patient is a 45 year old male presenting with concussion symptoms since a bunch of wooden board fell on his head at [employer]. Patient states today is very nauseous no vomiting, feels very lethargic and in a fog. Patient felt like yesterday was the worst of the days since the incident.  Patient states that the left side of his neck and head are very tight and sore with radiating pain down his arm with numbness, tingling, an weakness.    12/05/20 Patient states that his headaches are less frequent but more intense when he does have them. Patient has not thrown up for a couple days. Patient states he is still having trouble falling asleep. The neck and arm pain is a lot better, but feels weak all over. Patient having trouble remembering common things like his phone number or pin number, patient also states that he was worn down from just trying to cook dinner and washing dishes.   12/12/20 Patient states he is doing a little better than last week, headaches are going away quicker , short term memory still bad, sleep is still awful hardest thing is getting to sleep even with med's but when he does fall asleep it is a deeper sleep not getting the 8 hours of sleep maybe  5 hours feels groggy when he wakes.feels like he can do a little more when he is cooking dinner and washing dishes he is able to do more cardio about 6/7 minutes before he felt some sweat coming on but he was "messed" up for about 30 min was dizzy, does music and it is not clicking in his brain like he used to is supposed to start physical therapy Friday morning    12/20/2020 Patient states that he is doing  alright has been a rough week got sick in PT,went to second pt yesterday and did not get sick. MRI on Saturday and that was not a good experience has had the worst constant headaches since Saturday ,nothing has really helped. Still feels overall better than when accident  happened . nausea hasn't been as bad but it is still there, still short term memory issues     12/26/2020 Patient states that he is doing a lot better since he is getting some sleep with the increase to trazodone, hasn't been as symptomatic when he does get symptoms they subside quickly      1/3/203 Patient states that he's alright , still feeling the same as last week. Went back to work made it through Wednesday was a struggle at the end of the day thursday wasn't able to work Friday was overwhelmed and nausea . Doesn't remember if trazadone was called in for 150mg   tablets. Wants to talk about some meds      Concussion HPI:  - Injury date: 11/23/20   - Mechanism of injury: boards fell on head   - LOC: no  - Initial evaluation: 11/23/20  - Previous head injuries/concussions: no   - Previous imaging: yes CT of head and neck    - Social history: activities include works in mental health   Hospitalization for head injury? No Diagnosed/treated for headache disorder or migraines? No Diagnosed with learning disability 11/25/20? No Diagnosed with ADD/ADHD? No Diagnose with Depression, anxiety, or other Psychiatric Disorder? Yes anxiety    Current medications:  Current Outpatient Medications  Medication Sig Dispense Refill   ALPRAZolam (XANAX) 0.5 MG tablet Take 0.5 mg by mouth 2 (two) times daily as needed.     fexofenadine (ALLEGRA) 180 MG tablet Take 180 mg by mouth daily.     meloxicam (MOBIC) 15 MG tablet Take 1 tablet (15 mg total) by mouth daily. 30 tablet 3   methocarbamol (ROBAXIN) 500 MG tablet Take 1 tablet (500 mg total) by mouth 2 (two) times daily. 20 tablet 0   Multiple Vitamin (MULTIVITAMIN WITH MINERALS)  TABS tablet Take 1 tablet by mouth daily.     ondansetron (ZOFRAN ODT) 4 MG disintegrating tablet Take 1 tablet (4 mg total) by mouth every 8 (eight) hours as needed for nausea or vomiting. 20 tablet 0   ondansetron (ZOFRAN) 4 MG tablet Take 1 tablet (4 mg total) by mouth every 8 (eight) hours as needed. 20 tablet 0   sertraline (ZOLOFT) 100 MG tablet Take 150 mg by mouth daily.     traZODone (DESYREL) 100 MG tablet Take 1 tablet (100 mg total) by mouth at bedtime. 14 tablet 0   traZODone (DESYREL) 150 MG tablet Take 1 tablet (150 mg total) by mouth at bedtime as needed for sleep. 30 tablet 0   VITAMIN D PO Take by mouth.     No current facility-administered medications for this visit.      Objective:     There were no vitals filed  for this visit.    There is no height or weight on file to calculate BMI.    Physical Exam:     General: Well-appearing, cooperative, sitting comfortably in no acute distress.  Psychiatric: Mood and affect are appropriate.     Today's Symptom Severity Score:  Scores: 0-6  Headache:1 "Pressure in head":1  Neck Pain:0  Nausea or vomiting:2  Dizziness:2  Blurred vision:0  Balance problems:1  Sensitivity to light:0 Sensitivity to noise:2  Feeling slowed down:3  Feeling like in a fog:3  Dont feel right:3  Difficulty concentrating:4  Difficulty remembering:5  Fatigue or low energy:4  Confusion:3  Drowsiness:4  More emotional:4  Irritability:4  Sadness:4  Nervous or Anxious:3  Trouble falling asleep:5   Total number of symptoms: 19/22  Symptom Severity index: 58/132  Worse with physical activity? Yes  Worse with mental activity? Yes Percent improved since injury: 75%    Full pain-free cervical PROM: yes    Tandem gait: - Forward, eyes open: 0 errors - Backward, eyes open: 0 errors - Forward, eyes closed: 3 errors - Backward, eyes closed: 4 errors  VOMS:   - Baseline symptoms: 0 - Vertical Vestibular-Ocular Reflex: 0/10  -  Horizontal Vestibular-Ocular Reflex: 0/10  - Smooth pursuits: 0/10  - Vertical Saccades: Dizzy 2/10  - Horizontal Saccades: Dizzy 1/10  - Visual Motion Sensitivity Test: Dizzy 4/10  - Convergence: 9, 9 cm (<5 cm normal)     Electronically signed by:  [name] [name] 11:38 AM 01/05/21

## 2021-01-09 ENCOUNTER — Other Ambulatory Visit: Payer: Self-pay

## 2021-01-09 ENCOUNTER — Ambulatory Visit: Payer: BC Managed Care – PPO | Admitting: Sports Medicine

## 2021-01-09 VITALS — BP 130/80 | HR 79 | Ht 74.0 in | Wt 246.0 lb

## 2021-01-09 DIAGNOSIS — R270 Ataxia, unspecified: Secondary | ICD-10-CM

## 2021-01-09 DIAGNOSIS — S060X0A Concussion without loss of consciousness, initial encounter: Secondary | ICD-10-CM | POA: Diagnosis not present

## 2021-01-09 DIAGNOSIS — R4189 Other symptoms and signs involving cognitive functions and awareness: Secondary | ICD-10-CM | POA: Diagnosis not present

## 2021-01-09 DIAGNOSIS — G4700 Insomnia, unspecified: Secondary | ICD-10-CM

## 2021-01-09 DIAGNOSIS — M2569 Stiffness of other specified joint, not elsewhere classified: Secondary | ICD-10-CM | POA: Diagnosis not present

## 2021-01-09 DIAGNOSIS — R2681 Unsteadiness on feet: Secondary | ICD-10-CM | POA: Diagnosis not present

## 2021-01-09 DIAGNOSIS — R42 Dizziness and giddiness: Secondary | ICD-10-CM | POA: Diagnosis not present

## 2021-01-09 DIAGNOSIS — M542 Cervicalgia: Secondary | ICD-10-CM | POA: Diagnosis not present

## 2021-01-09 MED ORDER — AMANTADINE HCL 100 MG PO CAPS: 100.0000 mg | ORAL_CAPSULE | Freq: Two times a day (BID) | ORAL | 0 refills | Status: AC

## 2021-01-09 MED ORDER — TRAZODONE HCL 150 MG PO TABS: 150.0000 mg | ORAL_TABLET | Freq: Every day | ORAL | 0 refills | Status: AC

## 2021-01-09 NOTE — Patient Instructions (Addendum)
Good to see you  Trazadone 150 mg nightly  Amantadine 100 mg 2 times a day   Work note provided  1 week follow up

## 2021-01-10 ENCOUNTER — Ambulatory Visit: Payer: BC Managed Care – PPO | Admitting: Podiatry

## 2021-01-10 ENCOUNTER — Ambulatory Visit (INDEPENDENT_AMBULATORY_CARE_PROVIDER_SITE_OTHER): Payer: BC Managed Care – PPO

## 2021-01-10 ENCOUNTER — Encounter: Payer: Self-pay | Admitting: Podiatry

## 2021-01-10 DIAGNOSIS — M722 Plantar fascial fibromatosis: Secondary | ICD-10-CM | POA: Diagnosis not present

## 2021-01-10 DIAGNOSIS — R52 Pain, unspecified: Secondary | ICD-10-CM

## 2021-01-10 DIAGNOSIS — Z9889 Other specified postprocedural states: Secondary | ICD-10-CM

## 2021-01-10 MED ORDER — MELOXICAM 15 MG PO TABS: 15.0000 mg | ORAL_TABLET | Freq: Every day | ORAL | 0 refills | Status: AC

## 2021-01-10 MED ORDER — DEXAMETHASONE SODIUM PHOSPHATE 120 MG/30ML IJ SOLN
4.0000 mg | Freq: Once | INTRAMUSCULAR | Status: AC
  Administered 2021-01-10: 4 mg via INTRA_ARTICULAR

## 2021-01-10 NOTE — Progress Notes (Signed)
°  Subjective:  Patient ID: Carl Chang, male    DOB: 1975/11/27,   MRN: [id number]  Chief Complaint  Patient presents with   Foot Pain    Left foot plantar fasciitis pain has returned after surgery.     46 y.o. male presents for concern of continued left heel pain. Patient has been seen in the past for plantar fasciitis and had a plantar fasciotomy in May 2022. Relates he was doing well but in the last few weeks the pain has returned Never started PT. Has been thinking about going back in brace. Hoping for some relief today.  . Denies any other pedal complaints. Denies n/v/f/c.   Past Medical History:  Diagnosis Date   Hayfever    Pneumonia due to COVID-19 virus     Objective:  Physical Exam: Vascular: DP/PT pulses 2/4 bilateral. CFT <3 seconds. Normal hair growth on digits. No edema.  Skin. No lacerations or abrasions bilateral feet.  Musculoskeletal: MMT 5/5 bilateral lower extremities in DF, PF, Inversion and Eversion. Deceased ROM in DF of ankle joint. Tender to medial and lateral bands of plantar fascia on the left. No pain wit dorsiflexion. No pain along achilles or PT tendon. No pain with medial squeeze of calcaneus.  Neurological: Sensation intact to light touch.   Assessment:   1. Plantar fasciitis of left foot   2. Status post surgery      Plan:  Patient was evaluated and treated and all questions answered. Discussed plantar fasciitis with patient.  X-rays reviewed and discussed with patient. No acute fractures or dislocations noted. Mild spurring noted at inferior calcaneus.  Discussed treatment options including, ice, NSAIDS, supportive shoes, bracing, and stretching. Stretching exercises provided to be done on a daily basis.   Prescription for meloxicam provided and sent to pharmacy. Patient requesting injection today. Procedure note below.   Follow-up 6 weeks or sooner if any problems arise. In the meantime, encouraged to call the office with any questions,  concerns, change in symptoms.   Procedure:  Discussed etiology, pathology, conservative vs. surgical therapies. At this time a plantar fascial injection was recommended.  The patient agreed and a sterile skin prep was applied.  An injection consisting of  dexamethasone and marcaine mixture was infiltrated at the point of maximal tenderness on the left Heel.  Bandaid applied. The patient tolerated this well and was given instructions for aftercare.    Louann Sjogren, DPM

## 2021-01-11 DIAGNOSIS — R42 Dizziness and giddiness: Secondary | ICD-10-CM | POA: Diagnosis not present

## 2021-01-11 DIAGNOSIS — M2569 Stiffness of other specified joint, not elsewhere classified: Secondary | ICD-10-CM | POA: Diagnosis not present

## 2021-01-11 DIAGNOSIS — R2681 Unsteadiness on feet: Secondary | ICD-10-CM | POA: Diagnosis not present

## 2021-01-11 DIAGNOSIS — M542 Cervicalgia: Secondary | ICD-10-CM | POA: Diagnosis not present

## 2021-01-15 NOTE — Progress Notes (Addendum)
[name] [name] [address] [place] [address] Phone: [phone]  Assessment and Plan:     1. Concussion without loss of consciousness, initial encounter -Subacute, mild improvement, subsequent visit - General improvement in symptoms with patient limiting work from 3 to 4 hours from home over the past week, starting amantadine - New work note provided.  Patient may work up to 4 hours a day from home or at work as tolerated for the next 2 weeks or until reevaluated  2. Ataxia -Subacute, improving - Continue vestibular therapy.  Patient feels that he is able to adequately replicate the vestibular exercises at home.  If this is the case, he may continue HEP for vestibular therapy instead of clinic visits  3. Brain fog -Subacute, improved - Generalized improvement after starting amantadine 100 mg twice daily without side effects, as well as limiting work hours - Continue amantadine 100 mg twice daily.  Plan on completing additional 3 weeks of medication for a total of a 4-week treatment.  At that time we will decide if we want to repeat treatment for a second month  4. Insomnia, unspecified type -Subacute, improving - Significant difficulty with sleeping without trazodone and significant improvement in sleep when taking trazodone 150 mg nightly - Continue trazodone 150 mg nightly   Date of injury was 11/23/2020. Symptom severity scores of 17 and 30 today. Original symptom severity scores were 22 and 96. The patient was counseled on the nature of the injury, typical course and potential options for further evaluation and treatment. Discussed the importance of compliance with recommendations. Patient stated understanding of this plan and willingness to comply.  Recommendations:  -  Complete mental and physical rest for 48 hours after concussive event - Recommend light aerobic activity while keeping symptoms less than 3/10 - Stop mental or physical  activities that cause symptoms to worsen greater than 3/10, and wait 24 hours before attempting them again - Eliminate screen time as much as possible for first 48 hours after concussive event, then continue limited screen time (recommend less than 2 hours per day)   - Encouraged to RTC in 2 weeks for reassessment or sooner for any concerns or acute changes   Pertinent previous records reviewed include none   Time of visit 35 minutes, which included chart review, physical exam, treatment plan, symptom severity score, VOMS, and tandem gait testing being performed, interpreted, and discussed with patient at today's visit.   Subjective:   I, Moenique Starling Manns, am serving as a Neurosurgeon for Doctor [name]  Chief Complaint: concussion symptoms   HPI:  11/28/20 Patient is a 46 year old male presenting with concussion symptoms since a bunch of wooden board fell on his head at [employer]. Patient states today is very nauseous no vomiting, feels very lethargic and in a fog. Patient felt like yesterday was the worst of the days since the incident.  Patient states that the left side of his neck and head are very tight and sore with radiating pain down his arm with numbness, tingling, an weakness.    12/05/20 Patient states that his headaches are less frequent but more intense when he does have them. Patient has not thrown up for a couple days. Patient states he is still having trouble falling asleep. The neck and arm pain is a lot better, but feels weak all over. Patient having trouble remembering common things like his phone number or pin number, patient also states that  he was worn down from just trying to cook dinner and washing dishes.   12/12/20 Patient states he is doing a little better than last week, headaches are going away quicker , short term memory still bad, sleep is still awful hardest thing is getting to sleep even with med's but when he does fall asleep it is a deeper sleep not  getting the 8 hours of sleep maybe 5 hours feels groggy when he wakes.feels like he can do a little more when he is cooking dinner and washing dishes he is able to do more cardio about 6/7 minutes before he felt some sweat coming on but he was "messed" up for about 30 min was dizzy, does music and it is not clicking in his brain like he used to is supposed to start physical therapy Friday morning    12/20/2020 Patient states that he is doing alright has been a rough week got sick in PT,went to second pt yesterday and did not get sick. MRI on Saturday and that was not a good experience has had the worst constant headaches since Saturday ,nothing has really helped. Still feels overall better than when accident  happened . nausea hasn't been as bad but it is still there, still short term memory issues    12/26/2020 Patient states that he is doing a lot better since he is getting some sleep with the increase to trazodone, hasn't been as symptomatic when he does get symptoms they subside quickly     1/3/203 Patient states that he's alright , still feeling the same as last week. Went back to work made it through Wednesday was a struggle at the end of the day thursday wasn't able to work Friday was overwhelmed and nausea . Doesn't remember if trazadone was called in for 150mg   tablets. Wants to talk about some meds    01/16/2021 Patient states that he's feeling a lot better, work was alright no actual headaches, hasn't take his nausea meds hasn't needed them .      Concussion HPI:  - Injury date: 11/23/20   - Mechanism of injury: boards fell on head   - LOC: no  - Initial evaluation: 11/23/20  - Previous head injuries/concussions: no   - Previous imaging: yes CT of head and neck    - Social history: activities include works in mental health   Hospitalization for head injury? No Diagnosed/treated for headache disorder or migraines? No Diagnosed with learning disability [name]/dyslexia? No Diagnosed with  ADD/ADHD? No Diagnose with Depression, anxiety, or other Psychiatric Disorder? Yes anxiety      Current medications:  Current Outpatient Medications  Medication Sig Dispense Refill   ALPRAZolam (XANAX) 0.5 MG tablet Take 0.5 mg by mouth 2 (two) times daily as needed.     amantadine (SYMMETREL) 100 MG capsule Take 1 capsule (100 mg total) by mouth 2 (two) times daily. 60 capsule 0   fexofenadine (ALLEGRA) 180 MG tablet Take 180 mg by mouth daily.     meloxicam (MOBIC) 15 MG tablet Take 1 tablet (15 mg total) by mouth daily. 30 tablet 3   meloxicam (MOBIC) 15 MG tablet Take 1 tablet (15 mg total) by mouth daily. 30 tablet 0   methocarbamol (ROBAXIN) 500 MG tablet Take 1 tablet (500 mg total) by mouth 2 (two) times daily. 20 tablet 0   Multiple Vitamin (MULTIVITAMIN WITH MINERALS) TABS tablet Take 1 tablet by mouth daily.     ondansetron (ZOFRAN ODT) 4 MG disintegrating tablet  Take 1 tablet (4 mg total) by mouth every 8 (eight) hours as needed for nausea or vomiting. 20 tablet 0   ondansetron (ZOFRAN) 4 MG tablet Take 1 tablet (4 mg total) by mouth every 8 (eight) hours as needed. 20 tablet 0   sertraline (ZOLOFT) 100 MG tablet Take 150 mg by mouth daily.     traZODone (DESYREL) 100 MG tablet Take 1 tablet (100 mg total) by mouth at bedtime. 14 tablet 0   traZODone (DESYREL) 150 MG tablet Take 1 tablet (150 mg total) by mouth at bedtime as needed for sleep. 30 tablet 0   traZODone (DESYREL) 150 MG tablet Take 1 tablet (150 mg total) by mouth at bedtime. 14 tablet 0   VITAMIN D PO Take by mouth.     No current facility-administered medications for this visit.      Objective:     Vitals:   01/16/21 1008  BP: 130/90  Pulse: 75  SpO2: 97%  Weight: 246 lb (111.6 kg)  Height: 6\' 2"  (1.88 m)      Body mass index is 31.58 kg/m.    Physical Exam:     General: Well-appearing, cooperative, sitting comfortably in no acute distress.  Psychiatric: Mood and affect are appropriate.      Today's Symptom Severity Score:  Scores: 0-6  Headache:0 "Pressure in head":1  Neck Pain:0  Nausea or vomiting:0  Dizziness:1  Blurred vision:1  Balance problems:0  Sensitivity to light:0  Sensitivity to noise:1  Feeling slowed down:1  Feeling like in a fog:1  Dont feel right:1  Difficulty concentrating:2  Difficulty remembering:2  Fatigue or low energy:2  Confusion:1  Drowsiness:3  More emotional:3  Irritability:2  Sadness:2  Nervous or Anxious:2  Trouble falling asleep:4   Total number of symptoms: 17/22  Symptom Severity index: 30/132  Worse with physical activity? Yes  Worse with mental activity? Yes  Percent improved since injury: 90%    Full pain-free cervical PROM: yes    Tandem gait: - Forward, eyes open: 0 errors - Backward, eyes open: 2 errors - Forward, eyes closed: 2 errors - Backward, eyes closed: 3 errors  VOMS:   - Baseline symptoms: 0 - Vertical Vestibular-Ocular Reflex: 0/10  - Horizontal Vestibular-Ocular Reflex: 0/10  - Smooth pursuits: 0/10  - Vertical Saccades: 0/10  - Horizontal Saccades:  0/10  - Visual Motion Sensitivity Test: Dizzy 1/10  - Convergence: 8, 8 cm (<5 cm normal)     Electronically signed by:  07-15-1998 D.[name] 10:32 AM 01/16/21

## 2021-01-16 ENCOUNTER — Ambulatory Visit: Payer: BC Managed Care – PPO | Admitting: Sports Medicine

## 2021-01-16 ENCOUNTER — Other Ambulatory Visit: Payer: Self-pay

## 2021-01-16 VITALS — BP 130/90 | HR 75 | Ht 74.0 in | Wt 246.0 lb

## 2021-01-16 DIAGNOSIS — G4700 Insomnia, unspecified: Secondary | ICD-10-CM

## 2021-01-16 DIAGNOSIS — R270 Ataxia, unspecified: Secondary | ICD-10-CM | POA: Diagnosis not present

## 2021-01-16 DIAGNOSIS — R4189 Other symptoms and signs involving cognitive functions and awareness: Secondary | ICD-10-CM | POA: Diagnosis not present

## 2021-01-16 DIAGNOSIS — R42 Dizziness and giddiness: Secondary | ICD-10-CM | POA: Diagnosis not present

## 2021-01-16 DIAGNOSIS — R2681 Unsteadiness on feet: Secondary | ICD-10-CM | POA: Diagnosis not present

## 2021-01-16 DIAGNOSIS — M542 Cervicalgia: Secondary | ICD-10-CM | POA: Diagnosis not present

## 2021-01-16 DIAGNOSIS — M2569 Stiffness of other specified joint, not elsewhere classified: Secondary | ICD-10-CM | POA: Diagnosis not present

## 2021-01-16 DIAGNOSIS — S060X0A Concussion without loss of consciousness, initial encounter: Secondary | ICD-10-CM

## 2021-01-16 NOTE — Patient Instructions (Addendum)
Good to see you  New work note provided  Continue amantadine continue trazodone  2 week follow up

## 2021-01-19 ENCOUNTER — Other Ambulatory Visit: Payer: Self-pay | Admitting: Podiatry

## 2021-01-19 DIAGNOSIS — M722 Plantar fascial fibromatosis: Secondary | ICD-10-CM

## 2021-01-24 DIAGNOSIS — M2569 Stiffness of other specified joint, not elsewhere classified: Secondary | ICD-10-CM | POA: Diagnosis not present

## 2021-01-24 DIAGNOSIS — M542 Cervicalgia: Secondary | ICD-10-CM | POA: Diagnosis not present

## 2021-01-24 DIAGNOSIS — R42 Dizziness and giddiness: Secondary | ICD-10-CM | POA: Diagnosis not present

## 2021-01-24 DIAGNOSIS — R2681 Unsteadiness on feet: Secondary | ICD-10-CM | POA: Diagnosis not present

## 2021-01-29 NOTE — Progress Notes (Signed)
Carl SellsBen Vylette Chang D.Carl Chang. CAQSM [facility] [address] [place] [id number] Phone: [phone]  Assessment and Plan:     1. Concussion without loss of consciousness, subsequent encounter -Subacute, improving, subsequent visit - Moderate improvement in symptoms with patient tolerating 4-hour workdays - New work note provided.  Patient may work up to 6 hours a day maximum as tolerated for the next 2 weeks or until reevaluated  2. Brain fog -Subacute, improving - Continue amantadine 100 mg twice daily.  Refill given today.  Patient to continue medication for an additional 5 weeks to complete a 638-month total course  3. Ataxia -Subacute, improving - Moderate improvement in vestibular symptoms.  Patient may transition to doing exercises at home at this point  4. Insomnia, unspecified type -Subacute, improving - Morning drowsiness when using trazodone 150 mg nightly, and difficulty sleeping when not using medication - Decrease to trazodone 150 mg half tablet nightly   Date of injury was 11/23/2020. Symptom severity scores of 13 and 15 today. Original symptom severity scores were 22 and 96. The patient was counseled on the nature of the injury, typical course and potential options for further evaluation and treatment. Discussed the importance of compliance with recommendations. Patient stated understanding of this plan and willingness to comply.  Recommendations:  -  Complete mental and physical rest for 48 hours after concussive event - Recommend light aerobic activity while keeping symptoms less than 3/10 - Stop mental or physical activities that cause symptoms to worsen greater than 3/10, and wait 24 hours before attempting them again - Eliminate screen time as much as possible for first 48 hours after concussive event, then continue limited screen time (recommend less than 2 hours per day)   - Encouraged to RTC in 2 weeks for reassessment or sooner for any concerns or  acute changes   Pertinent previous records reviewed include none   Time of visit 32 minutes, which included chart review, physical exam, treatment plan, symptom severity score, VOMS, and tandem gait testing being performed, interpreted, and discussed with patient at today's visit.   Subjective:   I, Carl Chang, am serving as a Neurosurgeonscribe for Carl Richardean SaleBenjamin Devlin Chang  Chief Complaint: concussion symptoms   HPI:  11/28/20 Patient is a 46 year old male presenting with concussion symptoms since a bunch of wooden board fell on his head at [employer]. Patient states today is very nauseous no vomiting, feels very lethargic and in a fog. Patient felt like yesterday was the worst of the days since the incident.  Patient states that the left side of his neck and head are very tight and sore with radiating pain down his arm with numbness, tingling, an weakness.    12/05/20 Patient states that his headaches are less frequent but more intense when he does have them. Patient has not thrown up for a couple days. Patient states he is still having trouble falling asleep. The neck and arm pain is a lot better, but feels weak all over. Patient having trouble remembering common things like his phone number or pin number, patient also states that he was worn down from just trying to cook dinner and washing dishes.   12/12/20 Patient states he is doing a little better than last week, headaches are going away quicker , short term memory still bad, sleep is still awful hardest thing is getting to sleep even with med's but when he does fall asleep it is a deeper sleep not getting the 8  hours of sleep maybe 5 hours feels groggy when he wakes.feels like he can do a little more when he is cooking dinner and washing dishes he is able to do more cardio about 6/7 minutes before he felt some sweat coming on but he was "messed" up for about 30 min was dizzy, does music and it is not clicking in his brain like he used to is  supposed to start physical therapy Friday morning    12/20/2020 Patient states that he is doing alright has been a rough week got sick in PT,went to second pt yesterday and did not get sick. MRI on Saturday and that was not a good experience has had the worst constant headaches since Saturday ,nothing has really helped. Still feels overall better than when accident  happened . nausea hasn't been as bad but it is still there, still short term memory issues    12/26/2020 Patient states that he is doing a lot better since he is getting some sleep with the increase to trazodone, hasn't been as symptomatic when he does get symptoms they subside quickly     1/3/203 Patient states that he's alright , still feeling the same as last week. Went back to work made it through Wednesday was a struggle at the end of the day thursday wasn't able to work Friday was overwhelmed and nausea . Doesn't remember if trazadone was called in for 150mg   tablets. Wants to talk about some meds     01/16/2021 Patient states that he's feeling a lot better, work was alright no actual headaches, hasn't take his nausea meds hasn't needed them .    01/30/2021 Patient states that he's doing good very good , work is work but no headaches only taking half of trazadone due to making him drowsy in the am       Concussion HPI:  - Injury date: 11/23/20   - Mechanism of injury: boards fell on head   - LOC: no  - Initial evaluation: 11/23/20  - Previous head injuries/concussions: no   - Previous imaging: yes CT of head and neck    - Social history: activities include works in mental health   Hospitalization for head injury? No Diagnosed/treated for headache disorder or migraines? No Diagnosed with learning disability 11/25/20? No Diagnosed with ADD/ADHD? No Diagnose with Depression, anxiety, or other Psychiatric Disorder? Yes anxiety   Current medications:  Current Outpatient Medications  Medication Sig Dispense Refill    ALPRAZolam (XANAX) 0.5 MG tablet Take 0.5 mg by mouth 2 (two) times daily as needed.     amantadine (SYMMETREL) 100 MG capsule Take 1 capsule (100 mg total) by mouth 2 (two) times daily. 60 capsule 0   fexofenadine (ALLEGRA) 180 MG tablet Take 180 mg by mouth daily.     meloxicam (MOBIC) 15 MG tablet Take 1 tablet (15 mg total) by mouth daily. 30 tablet 0   methocarbamol (ROBAXIN) 500 MG tablet Take 1 tablet (500 mg total) by mouth 2 (two) times daily. 20 tablet 0   Multiple Vitamin (MULTIVITAMIN WITH MINERALS) TABS tablet Take 1 tablet by mouth daily.     sertraline (ZOLOFT) 100 MG tablet Take 150 mg by mouth daily.     traZODone (DESYREL) 150 MG tablet Take 1 tablet (150 mg total) by mouth at bedtime. 14 tablet 0   VITAMIN D PO Take by mouth.     No current facility-administered medications for this visit.      Objective:  Vitals:   01/30/21 0959  BP: 120/80  Pulse: 76  SpO2: 96%  Weight: 240 lb (108.9 kg)  Height: 6\' 2"  (1.88 m)      Body mass index is 30.81 kg/m.    Physical Exam:     General: Well-appearing, cooperative, sitting comfortably in no acute distress.  Psychiatric: Mood and affect are appropriate.     Today's Symptom Severity Score:  Scores: 0-6  Headache:0 "Pressure in head":0  Neck Pain:0  Nausea or vomiting:0  Dizziness:0  Blurred vision:0  Balance problems:1  Sensitivity to light:0  Sensitivity to noise:0  Feeling slowed down:1  Feeling like in a fog:1  Dont feel right:1  Difficulty concentrating:1  Difficulty remembering:1  Fatigue or low energy:2  Confusion:1  Drowsiness:2  More emotional:1  Irritability:1  Sadness:1  Nervous or Anxious:1  Trouble falling asleep:0   Total number of symptoms: 13/22  Symptom Severity index: 15/132  Worse with physical activity? Yes  Worse with mental activity? Yes  Percent improved since injury: 95%    Full pain-free cervical PROM: yes    Tandem gait: - Forward, eyes open: 0 errors -  Backward, eyes open: 1 errors - Forward, eyes closed: 4 errors - Backward, eyes closed: 5 errors  VOMS:   - Baseline symptoms: 0 - Horizontal Vestibular-Ocular Reflex: 0/10  - Vertical Vestibular-Ocular Reflex: 0/10  - Smooth pursuits: 0/10  - Horizontal Saccades:  0/10  - Vertical Saccades: 0/10  - Visual Motion Sensitivity Test:  0/10  - Convergence: 5, 5 cm (<5 cm normal)     Electronically signed by:  04-30-1978 D.Carl Chang [facility] 10:18 AM 01/30/21

## 2021-01-30 ENCOUNTER — Other Ambulatory Visit: Payer: Self-pay

## 2021-01-30 ENCOUNTER — Ambulatory Visit: Payer: BC Managed Care – PPO | Admitting: Sports Medicine

## 2021-01-30 VITALS — BP 120/80 | HR 76 | Ht 74.0 in | Wt 240.0 lb

## 2021-01-30 DIAGNOSIS — M542 Cervicalgia: Secondary | ICD-10-CM | POA: Diagnosis not present

## 2021-01-30 DIAGNOSIS — S060X0A Concussion without loss of consciousness, initial encounter: Secondary | ICD-10-CM

## 2021-01-30 DIAGNOSIS — R4189 Other symptoms and signs involving cognitive functions and awareness: Secondary | ICD-10-CM | POA: Diagnosis not present

## 2021-01-30 DIAGNOSIS — R2681 Unsteadiness on feet: Secondary | ICD-10-CM | POA: Diagnosis not present

## 2021-01-30 DIAGNOSIS — G4700 Insomnia, unspecified: Secondary | ICD-10-CM

## 2021-01-30 DIAGNOSIS — R270 Ataxia, unspecified: Secondary | ICD-10-CM

## 2021-01-30 DIAGNOSIS — M2569 Stiffness of other specified joint, not elsewhere classified: Secondary | ICD-10-CM | POA: Diagnosis not present

## 2021-01-30 DIAGNOSIS — R42 Dizziness and giddiness: Secondary | ICD-10-CM | POA: Diagnosis not present

## 2021-01-30 DIAGNOSIS — S060X0D Concussion without loss of consciousness, subsequent encounter: Secondary | ICD-10-CM

## 2021-01-30 MED ORDER — AMANTADINE HCL 100 MG PO CAPS: 100.0000 mg | ORAL_CAPSULE | Freq: Two times a day (BID) | ORAL | 0 refills | Status: AC

## 2021-01-30 NOTE — Patient Instructions (Addendum)
Good to see you Work note provided Continue amantadine 100 mg 2 times a day  Decrease trazodone 150 mg half tablet nightly at bed time  2 week follow up

## 2021-02-08 NOTE — Progress Notes (Signed)
Carl ChangKela Chang. CAQSM [facility] [address] [place] [id number] Phone: [phone]  Assessment and Plan:     1. Concussion without loss of consciousness, subsequent encounter -Subacute, improving, subsequent visit - Continued improvement in concussion-like symptoms with patient tolerating 6-hour workdays - New work note provided.  Patient may work up to 8 hours a day maximum as tolerated with rest breaks as needed for the next 2 weeks or until reevaluated  2. Brain fog -Subacute, improving - Continue amantadine 100 mg twice daily and complete 6754-month course of this medication  3. Ataxia -Subacute, improving - Continue home exercises  4. Insomnia, unspecified type -Subacute, unchanged - Generally well controlled when using trazodone 75 mg nightly - Decrease to trazodone 50 mg nightly.  New prescription sent  5. Sexual dysfunction - Subacute, unchanged - Continued sexual dysfunction since concussion - The symptoms should improve with time as concussion-like symptoms resolve.  We will not start medication at this time   Date of injury was 11/23/2020. Symptom severity scores of 13 and 15 today. Original symptom severity scores were 22 and 96. The patient was counseled on the nature of the injury, typical course and potential options for further evaluation and treatment. Discussed the importance of compliance with recommendations. Patient stated understanding of this plan and willingness to comply.  Recommendations:  -  Complete mental and physical rest for 48 hours after concussive event - Recommend light aerobic activity while keeping symptoms less than 3/10 - Stop mental or physical activities that cause symptoms to worsen greater than 3/10, and wait 24 hours before attempting them again - Eliminate screen time as much as possible for first 48 hours after concussive event, then continue limited screen time (recommend less than 2 hours per  day)   - Encouraged to RTC in 2 weeks for reassessment or sooner for any concerns or acute changes   Pertinent previous records reviewed include none   Time of visit 33 minutes, which included chart review, physical exam, treatment plan, symptom severity score, VOMS, and tandem gait testing being performed, interpreted, and discussed with patient at today's visit.   Subjective:   I, Carl Chang, am serving as a Neurosurgeonscribe for Doctor Carl Chang  Chief Complaint: concussion symptoms   HPI:  11/28/20 Patient is a 46 year old male presenting with concussion symptoms since a bunch of wooden board fell on his head at [employer]. Patient states today is very nauseous no vomiting, feels very lethargic and in a fog. Patient felt like yesterday was the worst of the days since the incident.  Patient states that the left side of his neck and head are very tight and sore with radiating pain down his arm with numbness, tingling, an weakness.    12/05/20 Patient states that his headaches are less frequent but more intense when he does have them. Patient has not thrown up for a couple days. Patient states he is still having trouble falling asleep. The neck and arm pain is a lot better, but feels weak all over. Patient having trouble remembering common things like his phone number or pin number, patient also states that he was worn down from just trying to cook dinner and washing dishes.   12/12/20 Patient states he is doing a little better than last week, headaches are going away quicker , short term memory still bad, sleep is still awful hardest thing is getting to sleep even with med's but when he does fall asleep it  is a deeper sleep not getting the 8 hours of sleep maybe 5 hours feels groggy when he wakes.feels like he can do a little more when he is cooking dinner and washing dishes he is able to do more cardio about 6/7 minutes before he felt some sweat coming on but he was "messed" up for about 30  min was dizzy, does music and it is not clicking in his brain like he used to is supposed to start physical therapy Friday morning    12/20/2020 Patient states that he is doing alright has been a rough week got sick in PT,went to second pt yesterday and did not get sick. MRI on Saturday and that was not a good experience has had the worst constant headaches since Saturday ,nothing has really helped. Still feels overall better than when accident  happened . nausea hasn't been as bad but it is still there, still short term memory issues    12/26/2020 Patient states that he is doing a lot better since he is getting some sleep with the increase to trazodone, hasn't been as symptomatic when he does get symptoms they subside quickly     1/3/203 Patient states that he's alright , still feeling the same as last week. Went back to work made it through Wednesday was a struggle at the end of the day thursday wasn't able to work Friday was overwhelmed and nausea . Doesn't remember if trazadone was called in for 150mg   tablets. Wants to talk about some meds     01/16/2021 Patient states that he's feeling a lot better, work was alright no actual headaches, hasn't take his nausea meds hasn't needed them .    01/30/2021 Patient states that he's doing good very good , work is work but no headaches only taking half of trazadone due to making him drowsy in the am     02/09/2021 Patient states that hes going good , work is work headaches are good hvent really had any issues.  He has tried to not use trazodone for sleep on several occasions, however was unable to fall asleep.  He has been using trazodone 150 mg half tablet (75 mg total) since last visit which has generally been working well for him.  He complains of continued sexual dysfunction including decreased desire that has been ongoing and has not changed significantly since original accident.     Concussion HPI:  - Injury date: 11/23/20   - Mechanism of  injury: boards fell on head   - LOC: no  - Initial evaluation: 11/23/20  - Previous head injuries/concussions: no   - Previous imaging: yes CT of head and neck    - Social history: activities include works in mental health   Hospitalization for head injury? No Diagnosed/treated for headache disorder or migraines? No Diagnosed with learning disability 11/25/20? No Diagnosed with ADD/ADHD? No Diagnose with Depression, anxiety, or other Psychiatric Disorder? Yes anxiety   Current medications:  Current Outpatient Medications  Medication Sig Dispense Refill   ALPRAZolam (XANAX) 0.5 MG tablet Take 0.5 mg by mouth 2 (two) times daily as needed.     amantadine (SYMMETREL) 100 MG capsule Take 1 capsule (100 mg total) by mouth 2 (two) times daily. 60 capsule 0   amantadine (SYMMETREL) 100 MG capsule Take 1 capsule (100 mg total) by mouth 2 (two) times daily. 30 capsule 0   fexofenadine (ALLEGRA) 180 MG tablet Take 180 mg by mouth daily.     meloxicam (MOBIC) 15  MG tablet Take 1 tablet (15 mg total) by mouth daily. 30 tablet 0   methocarbamol (ROBAXIN) 500 MG tablet Take 1 tablet (500 mg total) by mouth 2 (two) times daily. 20 tablet 0   Multiple Vitamin (MULTIVITAMIN WITH MINERALS) TABS tablet Take 1 tablet by mouth daily.     sertraline (ZOLOFT) 100 MG tablet Take 150 mg by mouth daily.     traZODone (DESYREL) 150 MG tablet Take 1 tablet (150 mg total) by mouth at bedtime. 14 tablet 0   traZODone (DESYREL) 50 MG tablet Take 1 tablet (50 mg total) by mouth at bedtime. 20 tablet 0   VITAMIN D PO Take by mouth.     No current facility-administered medications for this visit.      Objective:     Vitals:   02/09/21 1111  BP: 122/80  Pulse: 85  SpO2: 97%  Weight: 242 lb (109.8 kg)  Height: 6\' 2"  (1.88 m)      Body mass index is 31.07 kg/m.    Physical Exam:     General: Well-appearing, cooperative, sitting comfortably in no acute distress.  Psychiatric: Mood and affect are  appropriate.     Today's Symptom Severity Score:  Scores: 0-6  Headache:1 "Pressure in head":0  Neck Pain:0  Nausea or vomiting:0 Dizziness:1  Blurred vision:0  Balance problems:1  Sensitivity to light:0  Sensitivity to noise:0  Feeling slowed down:1  Feeling like in a fog:1  Dont feel right:1  Difficulty concentrating:0  Difficulty remembering:1  Fatigue or low energy:1  Confusion:0  Drowsiness:2  More emotional:1  Irritability:1  Sadness:1  Nervous or Anxious:0  Trouble falling asleep:2   Total number of symptoms: 13/22  Symptom Severity index: 15/132  Worse with physical activity? Yes  Worse with mental activity? Yes  Percent improved since injury: 95%    Full pain-free cervical PROM: yes    Tandem gait: - Forward, eyes open: 0 errors - Backward, eyes open: 0 errors - Forward, eyes closed: 2 errors - Backward, eyes closed: 2 errors  VOMS:   - Baseline symptoms: 0 - Horizontal Vestibular-Ocular Reflex: 0/10  - Vertical Vestibular-Ocular Reflex: 0/10  - Smooth pursuits: 0/10  - Horizontal Saccades:  0/10  - Vertical Saccades: 0/10  - Visual Motion Sensitivity Test:  0/10  - Convergence: 6,5cm (<5 cm normal)     Electronically signed by:  [birth year]30-1978 ChangCarl Chang Sports Medicine 11:37 AM 02/09/21

## 2021-02-09 ENCOUNTER — Other Ambulatory Visit: Payer: Self-pay

## 2021-02-09 ENCOUNTER — Ambulatory Visit: Payer: BC Managed Care – PPO | Admitting: Sports Medicine

## 2021-02-09 VITALS — BP 122/80 | HR 85 | Ht 74.0 in | Wt 242.0 lb

## 2021-02-09 DIAGNOSIS — R4189 Other symptoms and signs involving cognitive functions and awareness: Secondary | ICD-10-CM

## 2021-02-09 DIAGNOSIS — S060X0D Concussion without loss of consciousness, subsequent encounter: Secondary | ICD-10-CM

## 2021-02-09 DIAGNOSIS — G4700 Insomnia, unspecified: Secondary | ICD-10-CM | POA: Diagnosis not present

## 2021-02-09 DIAGNOSIS — R270 Ataxia, unspecified: Secondary | ICD-10-CM

## 2021-02-09 DIAGNOSIS — R37 Sexual dysfunction, unspecified: Secondary | ICD-10-CM

## 2021-02-09 MED ORDER — TRAZODONE HCL 50 MG PO TABS: 50.0000 mg | ORAL_TABLET | Freq: Every day | ORAL | 0 refills | Status: DC

## 2021-02-09 NOTE — Patient Instructions (Addendum)
Good to see you  Work note provided  2 week follow up  

## 2021-02-15 DIAGNOSIS — F411 Generalized anxiety disorder: Secondary | ICD-10-CM | POA: Diagnosis not present

## 2021-02-22 NOTE — Progress Notes (Signed)
[name] [name] [address] [place] [address] Phone: [phone]  Assessment and Plan:     1. Concussion without loss of consciousness, subsequent encounter -Chronic, improving, subsequent visit - Continued improvement in concussion symptoms with patient tolerating full work days - Released for full work without restriction.  No work note given today  2. Brain fog -Chronic, improving - Patient experienced side effects that he felt were from amantadine which included drowsiness and worsening sexual dysfunction, so patient self discontinued medication and symptoms improved - Discontinue amantadine due to side effects  3. Ataxia -Chronic, improving - Continue home exercises  4. Insomnia, unspecified type -Chronic, mild improvement - Generally well controlled using trazodone 50 mg nightly and increasing to trazodone 75 mg on nights that he feels he needs additional medication.  We will continue with this dosing regimen - No new medication sent today  5. Sexual dysfunction -Chronic, unchanged - Continued sexual dysfunction since concussion - Symptoms should continue to improve as concussion symptoms resolve.  We will not start medication at this time    Date of injury was 11/23/2020. Symptom severity scores of 11 and 14 today. Original symptom severity scores were 22 and 96. The patient was counseled on the nature of the injury, typical course and potential options for further evaluation and treatment. Discussed the importance of compliance with recommendations. Patient stated understanding of this plan and willingness to comply.  Recommendations:  -  Complete mental and physical rest for 48 hours after concussive event - Recommend light aerobic activity while keeping symptoms less than 3/10 - Stop mental or physical activities that cause symptoms to worsen greater than 3/10, and wait 24 hours before attempting them again - Eliminate screen  time as much as possible for first 48 hours after concussive event, then continue limited screen time (recommend less than 2 hours per day)   - Encouraged to RTC in 3 to 4 weeks for reassessment or sooner for any concerns or acute changes   Pertinent previous records reviewed include none   Time of visit 33 minutes, which included chart review, physical exam, treatment plan, symptom severity score, VOMS, and tandem gait testing being performed, interpreted, and discussed with patient at today's visit.   Subjective:   I, Jerene Canny, am serving as a Neurosurgeon for Doctor [name]  Chief Complaint: concussion symptoms   HPI:  HPI:  11/28/20 Patient is a 46 year old male presenting with concussion symptoms since a bunch of wooden board fell on his head at [employer]. Patient states today is very nauseous no vomiting, feels very lethargic and in a fog. Patient felt like yesterday was the worst of the days since the incident.  Patient states that the left side of his neck and head are very tight and sore with radiating pain down his arm with numbness, tingling, an weakness.    12/05/20 Patient states that his headaches are less frequent but more intense when he does have them. Patient has not thrown up for a couple days. Patient states he is still having trouble falling asleep. The neck and arm pain is a lot better, but feels weak all over. Patient having trouble remembering common things like his phone number or pin number, patient also states that he was worn down from just trying to cook dinner and washing dishes.   12/12/20 Patient states he is doing a little better than last week, headaches are going away quicker , short term  memory still bad, sleep is still awful hardest thing is getting to sleep even with med's but when he does fall asleep it is a deeper sleep not getting the 8 hours of sleep maybe 5 hours feels groggy when he wakes.feels like he can do a little more when he is  cooking dinner and washing dishes he is able to do more cardio about 6/7 minutes before he felt some sweat coming on but he was "messed" up for about 30 min was dizzy, does music and it is not clicking in his brain like he used to is supposed to start physical therapy Friday morning    12/20/2020 Patient states that he is doing alright has been a rough week got sick in PT,went to second pt yesterday and did not get sick. MRI on Saturday and that was not a good experience has had the worst constant headaches since Saturday ,nothing has really helped. Still feels overall better than when accident  happened . nausea hasn't been as bad but it is still there, still short term memory issues    12/26/2020 Patient states that he is doing a lot better since he is getting some sleep with the increase to trazodone, hasn't been as symptomatic when he does get symptoms they subside quickly     1/3/203 Patient states that he's alright , still feeling the same as last week. Went back to work made it through Wednesday was a struggle at the end of the day thursday wasn't able to work Friday was overwhelmed and nausea . Doesn't remember if trazadone was called in for 150mg   tablets. Wants to talk about some meds     01/16/2021 Patient states that he's feeling a lot better, work was alright no actual headaches, hasn't take his nausea meds hasn't needed them .    01/30/2021 Patient states that he's doing good very good , work is work but no headaches only taking half of trazadone due to making him drowsy in the am     02/09/2021 Patient states that hes going good , work is work headaches are good hvent really had any issues.  He has tried to not use trazodone for sleep on several occasions, however was unable to fall asleep.  He has been using trazodone 150 mg half tablet (75 mg total) since last visit which has generally been working well for him.  He complains of continued sexual dysfunction including decreased desire  that has been ongoing and has not changed significantly since original accident.   02/23/2021 Patient states that he is doing good , still having some short term memory issues , and some sleep issues stopped amantadine it made him very tired and groggy the entire day he was just out of it      Concussion HPI:  - Injury date: 11/23/20   - Mechanism of injury: boards fell on head   - LOC: no  - Initial evaluation: 11/23/20  - Previous head injuries/concussions: no   - Previous imaging: yes CT of head and neck    - Social history: activities include works in mental health   Hospitalization for head injury? No Diagnosed/treated for headache disorder or migraines? No Diagnosed with learning disability 11/25/20? No Diagnosed with ADD/ADHD? No Diagnose with Depression, anxiety, or other Psychiatric Disorder? Yes anxiety    Current medications:  Current Outpatient Medications  Medication Sig Dispense Refill   ALPRAZolam (XANAX) 0.5 MG tablet Take 0.5 mg by mouth 2 (two) times daily as needed.  amantadine (SYMMETREL) 100 MG capsule Take 1 capsule (100 mg total) by mouth 2 (two) times daily. 60 capsule 0   amantadine (SYMMETREL) 100 MG capsule Take 1 capsule (100 mg total) by mouth 2 (two) times daily. 30 capsule 0   fexofenadine (ALLEGRA) 180 MG tablet Take 180 mg by mouth daily.     meloxicam (MOBIC) 15 MG tablet Take 1 tablet (15 mg total) by mouth daily. 30 tablet 0   methocarbamol (ROBAXIN) 500 MG tablet Take 1 tablet (500 mg total) by mouth 2 (two) times daily. 20 tablet 0   Multiple Vitamin (MULTIVITAMIN WITH MINERALS) TABS tablet Take 1 tablet by mouth daily.     sertraline (ZOLOFT) 100 MG tablet Take 150 mg by mouth daily.     traZODone (DESYREL) 150 MG tablet Take 1 tablet (150 mg total) by mouth at bedtime. 14 tablet 0   traZODone (DESYREL) 50 MG tablet Take 1 tablet (50 mg total) by mouth at bedtime. 20 tablet 0   VITAMIN D PO Take by mouth.     No current  facility-administered medications for this visit.      Objective:     Vitals:   02/23/21 1104  BP: 130/80  Pulse: 86  SpO2: 97%  Weight: 239 lb (108.4 kg)  Height: 6\' 2"  (1.88 m)      Body mass index is 30.69 kg/m.    Physical Exam:     General: Well-appearing, cooperative, sitting comfortably in no acute distress.  Psychiatric: Mood and affect are appropriate.     Today's Symptom Severity Score:  Scores: 0-6  Headache:0 "Pressure in head":0  Neck Pain:0  Nausea or vomiting:0  Dizziness:0  Blurred vision:0  Balance problems:0  Sensitivity to light:0  Sensitivity to noise:0  Feeling slowed down:1  Feeling like in a fog:1  Dont feel right:1  Difficulty concentrating:1  Difficulty remembering:2  Fatigue or low energy:1  Confusion:0  Drowsiness:2  More emotional:1  Irritability:0  Sadness:1  Nervous or Anxious:0  Trouble falling asleep:3   Total number of symptoms: 11/22  Symptom Severity index: 14/132  Worse with physical activity? No Worse with mental activity? Yes  Percent improved since injury: 95%    Full pain-free cervical PROM: yes    Tandem gait: - Forward, eyes open: 0 errors - Backward, eyes open: 0 errors - Forward, eyes closed: 2 errors - Backward, eyes closed: 2 errors  VOMS:   - Baseline symptoms: 0 - Horizontal Vestibular-Ocular Reflex: 0/10  - Vertical Vestibular-Ocular Reflex: 0/10  - Smooth pursuits: 0/10  - Horizontal Saccades:  0/10  - Vertical Saccades: 0/10  - Visual Motion Sensitivity Test:  0/10  - Convergence: 3, 4 cm (<5 cm normal)     Electronically signed by:  [name] [name] 12:00 PM 02/23/21

## 2021-02-23 ENCOUNTER — Ambulatory Visit: Payer: BC Managed Care – PPO | Admitting: Sports Medicine

## 2021-02-23 ENCOUNTER — Other Ambulatory Visit: Payer: Self-pay

## 2021-02-23 VITALS — BP 130/80 | HR 86 | Ht 74.0 in | Wt 239.0 lb

## 2021-02-23 DIAGNOSIS — R4189 Other symptoms and signs involving cognitive functions and awareness: Secondary | ICD-10-CM | POA: Diagnosis not present

## 2021-02-23 DIAGNOSIS — S060X0D Concussion without loss of consciousness, subsequent encounter: Secondary | ICD-10-CM

## 2021-02-23 DIAGNOSIS — G4700 Insomnia, unspecified: Secondary | ICD-10-CM

## 2021-02-23 DIAGNOSIS — R37 Sexual dysfunction, unspecified: Secondary | ICD-10-CM

## 2021-02-23 DIAGNOSIS — R270 Ataxia, unspecified: Secondary | ICD-10-CM

## 2021-02-23 NOTE — Patient Instructions (Addendum)
Good to see you  3-4 week follow up  

## 2021-03-01 DIAGNOSIS — F411 Generalized anxiety disorder: Secondary | ICD-10-CM | POA: Diagnosis not present

## 2021-03-02 DIAGNOSIS — Z125 Encounter for screening for malignant neoplasm of prostate: Secondary | ICD-10-CM | POA: Diagnosis not present

## 2021-03-02 DIAGNOSIS — E559 Vitamin D deficiency, unspecified: Secondary | ICD-10-CM | POA: Diagnosis not present

## 2021-03-02 DIAGNOSIS — Z0000 Encounter for general adult medical examination without abnormal findings: Secondary | ICD-10-CM | POA: Diagnosis not present

## 2021-03-02 DIAGNOSIS — E7800 Pure hypercholesterolemia, unspecified: Secondary | ICD-10-CM | POA: Diagnosis not present

## 2021-03-02 NOTE — Telephone Encounter (Signed)
Seen by Dr. Glennon Mac

## 2021-03-15 DIAGNOSIS — F411 Generalized anxiety disorder: Secondary | ICD-10-CM | POA: Diagnosis not present

## 2021-03-20 NOTE — Progress Notes (Signed)
? Carl Sells D.Judd Chang ?[facility] ?[address] [place] [address] ?Phone: [phone] ? ?Assessment and Plan:   ?  ?1. Concussion without loss of consciousness, subsequent encounter ?-Chronic, nearly resolved, subsequent visit ?- Continued improvement in concussion-like symptoms with patient tolerating full work without restrictions ?- No restrictions.  Encouraged to continue full mental and physical activity ? ?2. Brain fog ?-Chronic, improving ?- Overall improving ? ?3. Ataxia ?-Chronic, improving ?- Continue exercises ? ?4. Insomnia, unspecified type ?-Chronic, mild improvement ?- Restart melatonin 5 mg nightly ?- We will refill trazodone 50 mg 30 tablets, 0 refills.  Recommend using trazodone as needed.  Can break tablets in half and take during alternating days with goal of eventually discontinuing medication ? ?5. Sexual dysfunction ?-Chronic, unchanged ?- Continued symptoms since concussion ?- Continue treatment with PCP ?  ?Date of injury was 11/23/2020. Symptom severity scores of 4 and 4 today. Original symptom severity scores were 22 and 96. The patient was counseled on the nature of the injury, typical course and potential options for further evaluation and treatment. Discussed the importance of compliance with recommendations. Patient stated understanding of this plan and willingness to comply. ? ?Recommendations:  ?-  Complete mental and physical rest for 48 hours after concussive event ?- Recommend light aerobic activity while keeping symptoms less than 3/10 ?- Stop mental or physical activities that cause symptoms to worsen greater than 3/10, and wait 24 hours before attempting them again ?- Eliminate screen time as much as possible for first 48 hours after concussive event, then continue limited screen time (recommend less than 2 hours per day) ? ? ?- Encouraged to RTC in 3 weeks for reassessment or sooner for any concerns or acute changes.  If patient is able to completely  wean off of trazodone, could provide full clearance at that time ? ?Pertinent previous records reviewed include none ?  ?Time of visit 33 minutes, which included chart review, physical exam, treatment plan, symptom severity score, VOMS, and tandem gait testing being performed, interpreted, and discussed with patient at today's visit. ?  ?Subjective:   ?I, Carl Chang, am serving as a scribe for Dr. [name] ? ?Chief Complaint: concussion symptoms  ? ?HPI:  ?11/28/20 ?Patient is a 46 year old male presenting with concussion symptoms since a bunch of wooden board fell on his head at [employer]. Patient states today is very nauseous no vomiting, feels very lethargic and in a fog. Patient felt like yesterday was the worst of the days since the incident.  Patient states that the left side of his neck and head are very tight and sore with radiating pain down his arm with numbness, tingling, an weakness.  ?  ?12/05/20 ?Patient states that his headaches are less frequent but more intense when he does have them. Patient has not thrown up for a couple days. Patient states he is still having trouble falling asleep. The neck and arm pain is a lot better, but feels weak all over. Patient having trouble remembering common things like his phone number or pin number, patient also states that he was worn down from just trying to cook dinner and washing dishes. ?  ?12/12/20 ?Patient states he is doing a little better than last week, headaches are going away quicker , short term memory still bad, sleep is still awful hardest thing is getting to sleep even with med's but when he does fall asleep it is a deeper sleep not getting the 8  hours of sleep maybe 5 hours feels groggy when he wakes.feels like he can do a little more when he is cooking dinner and washing dishes he is able to do more cardio about 6/7 minutes before he felt some sweat coming on but he was "messed" up for about 30 min was dizzy, does music and it is not  clicking in his brain like he used to is supposed to start physical therapy Friday morning  ?  ?12/20/2020 ?Patient states that he is doing alright has been a rough week got sick in PT,went to second pt yesterday and did not get sick. MRI on Saturday and that was not a good experience has had the worst constant headaches since Saturday ,nothing has really helped. Still feels overall better than when accident  happened . nausea hasn't been as bad but it is still there, still short term memory issues  ?  ?12/26/2020 ?Patient states that he is doing a lot better since he is getting some sleep with the increase to trazodone, hasn't been as symptomatic when he does get symptoms they subside quickly   ?  ?1/3/203 ?Patient states that he's alright , still feeling the same as last week. Went back to work made it through Wednesday was a struggle at the end of the day thursday wasn't able to work Friday was overwhelmed and nausea . Doesn't remember if trazadone was called in for 150mg   tablets. Wants to talk about some meds   ?  ?01/16/2021 ?Patient states that he's feeling a lot better, work was alright no actual headaches, hasn't take his nausea meds hasn't needed them . ?  ? 01/30/2021 ?Patient states that he's doing good very good , work is work but no headaches only taking half of trazadone due to making him drowsy in the am   ?  ?02/09/2021 ?Patient states that hes going good , work is work headaches are good hvent really had any issues.  He has tried to not use trazodone for sleep on several occasions, however was unable to fall asleep.  He has been using trazodone 150 mg half tablet (75 mg total) since last visit which has generally been working well for him.  He complains of continued sexual dysfunction including decreased desire that has been ongoing and has not changed significantly since original accident. ?  ?02/23/2021 ?Patient states that he is doing good , still having some short term memory issues , and some sleep  issues stopped amantadine it made him very tired and groggy the entire day he was just out of it  ?  ?03/21/2021 ?Patient states that he is doing better, same issues with the memory and sleep but a lot less issues. Patient is able to get to sleep with the trazadone.  ? ?  ?  Concussion HPI:  ?- Injury date: 11/23/20   ?- Mechanism of injury: boards fell on head   ?- LOC: no  ?- Initial evaluation: 11/23/20  ?- Previous head injuries/concussions: no   ?- Previous imaging: yes CT of head and neck    ?- Social history: activities include works in Proofreadermental health ?  ?Hospitalization for head injury? No ?Diagnosed/treated for headache disorder or migraines? No ?Diagnosed with learning disability [name]/dyslexia? No ?Diagnosed with ADD/ADHD? No ?Diagnose with Depression, anxiety, or other Psychiatric Disorder? Yes anxiety ?  ?Current medications:  ?Current Outpatient Medications  ?Medication Sig Dispense Refill  ? ALPRAZolam (XANAX) 0.5 MG tablet Take 0.5 mg by mouth 2 (two) times daily  as needed.    ? amantadine (SYMMETREL) 100 MG capsule Take 1 capsule (100 mg total) by mouth 2 (two) times daily. 60 capsule 0  ? amantadine (SYMMETREL) 100 MG capsule Take 1 capsule (100 mg total) by mouth 2 (two) times daily. 30 capsule 0  ? fexofenadine (ALLEGRA) 180 MG tablet Take 180 mg by mouth daily.    ? meloxicam (MOBIC) 15 MG tablet Take 1 tablet (15 mg total) by mouth daily. 30 tablet 0  ? methocarbamol (ROBAXIN) 500 MG tablet Take 1 tablet (500 mg total) by mouth 2 (two) times daily. 20 tablet 0  ? Multiple Vitamin (MULTIVITAMIN WITH MINERALS) TABS tablet Take 1 tablet by mouth daily.    ? sertraline (ZOLOFT) 100 MG tablet Take 150 mg by mouth daily.    ? traZODone (DESYREL) 150 MG tablet Take 1 tablet (150 mg total) by mouth at bedtime. 14 tablet 0  ? VITAMIN D PO Take by mouth.    ? traZODone (DESYREL) 50 MG tablet Take 1 tablet (50 mg total) by mouth at bedtime. 30 tablet 0  ? ?No current facility-administered medications for this  visit.  ? ? ?  ?Objective:   ?  ?Vitals:  ? 03/21/21 1059  ?BP: 114/80  ?Pulse: 87  ?SpO2: 97%  ?Weight: 241 lb (109.3 kg)  ?Height: 6\' 2"  (1.88 m)  ?  ?  ?Body mass index is 30.94 kg/m?.  ?  ?Physical

## 2021-03-21 ENCOUNTER — Other Ambulatory Visit: Payer: Self-pay

## 2021-03-21 ENCOUNTER — Ambulatory Visit (INDEPENDENT_AMBULATORY_CARE_PROVIDER_SITE_OTHER): Payer: BC Managed Care – PPO | Admitting: Sports Medicine

## 2021-03-21 VITALS — BP 114/80 | HR 87 | Ht 74.0 in | Wt 241.0 lb

## 2021-03-21 DIAGNOSIS — S060X0D Concussion without loss of consciousness, subsequent encounter: Secondary | ICD-10-CM

## 2021-03-21 DIAGNOSIS — G4700 Insomnia, unspecified: Secondary | ICD-10-CM

## 2021-03-21 DIAGNOSIS — R4189 Other symptoms and signs involving cognitive functions and awareness: Secondary | ICD-10-CM | POA: Diagnosis not present

## 2021-03-21 DIAGNOSIS — R270 Ataxia, unspecified: Secondary | ICD-10-CM | POA: Diagnosis not present

## 2021-03-21 DIAGNOSIS — R37 Sexual dysfunction, unspecified: Secondary | ICD-10-CM

## 2021-03-21 MED ORDER — TRAZODONE HCL 50 MG PO TABS: 50.0000 mg | ORAL_TABLET | Freq: Every day | ORAL | 0 refills | Status: AC

## 2021-03-21 NOTE — Patient Instructions (Addendum)
Good to see you  ?Trazodone 50mg  sent in  ?Recommend weaning of trazodone can take 50mg  or cut in half for 25 then eventually use every other day then eventually discontinue ?Restart melatonin 5mg  nightly  ?No restrictions ?Follow up in 3 weeks  ?

## 2021-03-23 DIAGNOSIS — F12920 Cannabis use, unspecified with intoxication, uncomplicated: Secondary | ICD-10-CM | POA: Diagnosis not present

## 2021-03-29 DIAGNOSIS — F411 Generalized anxiety disorder: Secondary | ICD-10-CM | POA: Diagnosis not present
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
## 2021-04-11 ENCOUNTER — Ambulatory Visit: Payer: BC Managed Care – PPO | Admitting: Sports Medicine

## 2021-04-11 VITALS — BP 118/72 | HR 81 | Ht 74.0 in | Wt 242.0 lb

## 2021-04-11 DIAGNOSIS — R270 Ataxia, unspecified: Secondary | ICD-10-CM

## 2021-04-11 DIAGNOSIS — R4189 Other symptoms and signs involving cognitive functions and awareness: Secondary | ICD-10-CM | POA: Diagnosis not present

## 2021-04-11 DIAGNOSIS — G4700 Insomnia, unspecified: Secondary | ICD-10-CM | POA: Diagnosis not present

## 2021-04-11 DIAGNOSIS — R37 Sexual dysfunction, unspecified: Secondary | ICD-10-CM

## 2021-04-11 DIAGNOSIS — S060X0D Concussion without loss of consciousness, subsequent encounter: Secondary | ICD-10-CM | POA: Diagnosis not present

## 2021-04-11 NOTE — Patient Instructions (Addendum)
Good to see you  Work note provided  As needed follow up  

## 2021-04-12 DIAGNOSIS — F411 Generalized anxiety disorder: Secondary | ICD-10-CM | POA: Diagnosis not present

## 2021-04-26 DIAGNOSIS — F411 Generalized anxiety disorder: Secondary | ICD-10-CM | POA: Diagnosis not present

## 2021-05-09 DIAGNOSIS — Z20822 Contact with and (suspected) exposure to covid-19: Secondary | ICD-10-CM | POA: Diagnosis not present

## 2021-05-24 DIAGNOSIS — F411 Generalized anxiety disorder: Secondary | ICD-10-CM | POA: Diagnosis not present

## 2021-06-07 DIAGNOSIS — F411 Generalized anxiety disorder: Secondary | ICD-10-CM | POA: Diagnosis not present

## 2021-06-12 DIAGNOSIS — G4700 Insomnia, unspecified: Secondary | ICD-10-CM | POA: Diagnosis not present

## 2021-06-14 ENCOUNTER — Other Ambulatory Visit: Payer: Self-pay

## 2021-06-14 ENCOUNTER — Emergency Department (HOSPITAL_BASED_OUTPATIENT_CLINIC_OR_DEPARTMENT_OTHER)
Admission: EM | Admit: 2021-06-14 | Discharge: 2021-06-14 | Disposition: A | Discharge status: Home / Self Care | Payer: BC Managed Care – PPO | Attending: Emergency Medicine | Admitting: Emergency Medicine

## 2021-06-14 ENCOUNTER — Encounter (HOSPITAL_BASED_OUTPATIENT_CLINIC_OR_DEPARTMENT_OTHER): Payer: Self-pay

## 2021-06-14 ENCOUNTER — Emergency Department (HOSPITAL_BASED_OUTPATIENT_CLINIC_OR_DEPARTMENT_OTHER): Payer: BC Managed Care – PPO

## 2021-06-14 DIAGNOSIS — Z79899 Other long term (current) drug therapy: Secondary | ICD-10-CM | POA: Insufficient documentation

## 2021-06-14 DIAGNOSIS — S0990XA Unspecified injury of head, initial encounter: Secondary | ICD-10-CM | POA: Diagnosis not present

## 2021-06-14 DIAGNOSIS — M542 Cervicalgia: Secondary | ICD-10-CM | POA: Diagnosis not present

## 2021-06-14 DIAGNOSIS — M545 Low back pain, unspecified: Secondary | ICD-10-CM | POA: Insufficient documentation

## 2021-06-14 DIAGNOSIS — Y92410 Unspecified street and highway as the place of occurrence of the external cause: Secondary | ICD-10-CM | POA: Insufficient documentation

## 2021-06-14 DIAGNOSIS — S060X0A Concussion without loss of consciousness, initial encounter: Secondary | ICD-10-CM | POA: Insufficient documentation
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
## 2021-06-14 MED ORDER — CYCLOBENZAPRINE HCL 10 MG PO TABS: 10.0000 mg | ORAL_TABLET | Freq: Two times a day (BID) | ORAL | 0 refills | Status: AC | PRN
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
## 2021-07-05 DIAGNOSIS — F411 Generalized anxiety disorder: Secondary | ICD-10-CM | POA: Diagnosis not present

## 2021-07-11 DIAGNOSIS — G4700 Insomnia, unspecified: Secondary | ICD-10-CM | POA: Diagnosis not present

## 2021-08-02 DIAGNOSIS — K4090 Unilateral inguinal hernia, without obstruction or gangrene, not specified as recurrent: Secondary | ICD-10-CM | POA: Diagnosis not present

## 2021-08-02 DIAGNOSIS — F411 Generalized anxiety disorder: Secondary | ICD-10-CM | POA: Diagnosis not present

## 2021-08-09 DIAGNOSIS — G4700 Insomnia, unspecified: Secondary | ICD-10-CM | POA: Diagnosis not present

## 2021-08-13 ENCOUNTER — Other Ambulatory Visit: Payer: Self-pay | Admitting: Surgery

## 2021-08-13 DIAGNOSIS — K4090 Unilateral inguinal hernia, without obstruction or gangrene, not specified as recurrent: Secondary | ICD-10-CM | POA: Diagnosis not present

## 2021-08-13 DIAGNOSIS — R1032 Left lower quadrant pain: Secondary | ICD-10-CM | POA: Diagnosis not present

## 2021-08-13 DIAGNOSIS — K429 Umbilical hernia without obstruction or gangrene: Secondary | ICD-10-CM | POA: Diagnosis not present

## 2021-08-16 DIAGNOSIS — F411 Generalized anxiety disorder: Secondary | ICD-10-CM | POA: Diagnosis not present

## 2021-08-30 DIAGNOSIS — F411 Generalized anxiety disorder: Secondary | ICD-10-CM | POA: Diagnosis not present

## 2021-09-06 DIAGNOSIS — K4020 Bilateral inguinal hernia, without obstruction or gangrene, not specified as recurrent: Secondary | ICD-10-CM | POA: Diagnosis not present

## 2021-09-06 DIAGNOSIS — K429 Umbilical hernia without obstruction or gangrene: Secondary | ICD-10-CM | POA: Diagnosis not present

## 2021-09-27 DIAGNOSIS — F411 Generalized anxiety disorder: Secondary | ICD-10-CM | POA: Diagnosis not present

## 2021-10-11 DIAGNOSIS — F411 Generalized anxiety disorder: Secondary | ICD-10-CM | POA: Diagnosis not present

## 2021-10-25 DIAGNOSIS — F411 Generalized anxiety disorder: Secondary | ICD-10-CM | POA: Diagnosis not present

## 2021-11-08 DIAGNOSIS — F411 Generalized anxiety disorder: Secondary | ICD-10-CM | POA: Diagnosis not present

## 2021-11-22 DIAGNOSIS — F411 Generalized anxiety disorder: Secondary | ICD-10-CM | POA: Diagnosis not present

## 2021-12-04 DIAGNOSIS — M7711 Lateral epicondylitis, right elbow: Secondary | ICD-10-CM | POA: Diagnosis not present

## 2021-12-04 DIAGNOSIS — M545 Low back pain, unspecified: Secondary | ICD-10-CM | POA: Diagnosis not present

## 2021-12-04 DIAGNOSIS — M5412 Radiculopathy, cervical region: Secondary | ICD-10-CM | POA: Diagnosis not present

## 2021-12-06 DIAGNOSIS — F411 Generalized anxiety disorder: Secondary | ICD-10-CM | POA: Diagnosis not present

## 2021-12-11 DIAGNOSIS — M25521 Pain in right elbow: Secondary | ICD-10-CM | POA: Diagnosis not present

## 2021-12-12 DIAGNOSIS — M7711 Lateral epicondylitis, right elbow: Secondary | ICD-10-CM | POA: Diagnosis not present

## 2021-12-12 DIAGNOSIS — M5412 Radiculopathy, cervical region: Secondary | ICD-10-CM | POA: Diagnosis not present

## 2021-12-12 DIAGNOSIS — M6281 Muscle weakness (generalized): Secondary | ICD-10-CM | POA: Diagnosis not present

## 2021-12-18 DIAGNOSIS — M5412 Radiculopathy, cervical region: Secondary | ICD-10-CM | POA: Diagnosis not present

## 2021-12-18 DIAGNOSIS — M7711 Lateral epicondylitis, right elbow: Secondary | ICD-10-CM | POA: Diagnosis not present

## 2021-12-18 DIAGNOSIS — M6281 Muscle weakness (generalized): Secondary | ICD-10-CM | POA: Diagnosis not present

## 2021-12-19 DIAGNOSIS — M542 Cervicalgia: Secondary | ICD-10-CM | POA: Diagnosis not present

## 2021-12-19 DIAGNOSIS — M25521 Pain in right elbow: Secondary | ICD-10-CM | POA: Diagnosis not present

## 2021-12-20 DIAGNOSIS — F411 Generalized anxiety disorder: Secondary | ICD-10-CM | POA: Diagnosis not present

## 2021-12-25 DIAGNOSIS — M5412 Radiculopathy, cervical region: Secondary | ICD-10-CM | POA: Diagnosis not present

## 2021-12-25 DIAGNOSIS — F419 Anxiety disorder, unspecified: Secondary | ICD-10-CM | POA: Diagnosis not present

## 2021-12-25 DIAGNOSIS — M7711 Lateral epicondylitis, right elbow: Secondary | ICD-10-CM | POA: Diagnosis not present

## 2021-12-25 DIAGNOSIS — M6281 Muscle weakness (generalized): Secondary | ICD-10-CM | POA: Diagnosis not present

## 2021-12-25 DIAGNOSIS — M25521 Pain in right elbow: Secondary | ICD-10-CM | POA: Diagnosis not present

## 2021-12-25 DIAGNOSIS — G4700 Insomnia, unspecified: Secondary | ICD-10-CM | POA: Diagnosis not present

## 2022-01-03 DIAGNOSIS — F411 Generalized anxiety disorder: Secondary | ICD-10-CM | POA: Diagnosis not present

## 2022-01-04 DIAGNOSIS — R509 Fever, unspecified: Secondary | ICD-10-CM | POA: Diagnosis not present

## 2022-01-04 DIAGNOSIS — J40 Bronchitis, not specified as acute or chronic: Secondary | ICD-10-CM | POA: Diagnosis not present

## 2022-01-04 DIAGNOSIS — G4700 Insomnia, unspecified: Secondary | ICD-10-CM | POA: Diagnosis not present

## 2022-02-14 DIAGNOSIS — F411 Generalized anxiety disorder: Secondary | ICD-10-CM | POA: Diagnosis not present

## 2022-02-19 DIAGNOSIS — B029 Zoster without complications: Secondary | ICD-10-CM | POA: Diagnosis not present

## 2022-02-28 DIAGNOSIS — F411 Generalized anxiety disorder: Secondary | ICD-10-CM | POA: Diagnosis not present

## 2022-03-07 DIAGNOSIS — F419 Anxiety disorder, unspecified: Secondary | ICD-10-CM | POA: Diagnosis not present

## 2022-03-07 DIAGNOSIS — F5104 Psychophysiologic insomnia: Secondary | ICD-10-CM | POA: Diagnosis not present

## 2022-03-14 DIAGNOSIS — F411 Generalized anxiety disorder: Secondary | ICD-10-CM | POA: Diagnosis not present

## 2022-03-19 DIAGNOSIS — E7800 Pure hypercholesterolemia, unspecified: Secondary | ICD-10-CM | POA: Diagnosis not present

## 2022-03-19 DIAGNOSIS — N528 Other male erectile dysfunction: Secondary | ICD-10-CM | POA: Diagnosis not present

## 2022-03-19 DIAGNOSIS — Z125 Encounter for screening for malignant neoplasm of prostate: Secondary | ICD-10-CM | POA: Diagnosis not present

## 2022-03-19 DIAGNOSIS — Z0000 Encounter for general adult medical examination without abnormal findings: Secondary | ICD-10-CM | POA: Diagnosis not present

## 2022-03-28 DIAGNOSIS — F411 Generalized anxiety disorder: Secondary | ICD-10-CM | POA: Diagnosis not present

## 2022-04-11 DIAGNOSIS — F411 Generalized anxiety disorder: Secondary | ICD-10-CM | POA: Diagnosis not present

## 2022-04-25 DIAGNOSIS — F411 Generalized anxiety disorder: Secondary | ICD-10-CM | POA: Diagnosis not present

## 2022-05-09 DIAGNOSIS — F411 Generalized anxiety disorder: Secondary | ICD-10-CM | POA: Diagnosis not present

## 2022-05-23 DIAGNOSIS — F5232 Male orgasmic disorder: Secondary | ICD-10-CM | POA: Diagnosis not present

## 2022-05-23 DIAGNOSIS — F411 Generalized anxiety disorder: Secondary | ICD-10-CM | POA: Diagnosis not present

## 2022-05-23 DIAGNOSIS — N529 Male erectile dysfunction, unspecified: Secondary | ICD-10-CM | POA: Diagnosis not present

## 2022-06-06 DIAGNOSIS — F411 Generalized anxiety disorder: Secondary | ICD-10-CM | POA: Diagnosis not present

## 2022-07-04 DIAGNOSIS — F411 Generalized anxiety disorder: Secondary | ICD-10-CM | POA: Diagnosis not present

## 2022-08-01 DIAGNOSIS — F411 Generalized anxiety disorder: Secondary | ICD-10-CM | POA: Diagnosis not present

## 2022-08-08 DIAGNOSIS — N529 Male erectile dysfunction, unspecified: Secondary | ICD-10-CM | POA: Diagnosis not present

## 2022-08-08 DIAGNOSIS — F5232 Male orgasmic disorder: Secondary | ICD-10-CM | POA: Diagnosis not present

## 2022-08-13 DIAGNOSIS — N529 Male erectile dysfunction, unspecified: Secondary | ICD-10-CM | POA: Diagnosis not present

## 2022-08-21 DIAGNOSIS — H00024 Hordeolum internum left upper eyelid: Secondary | ICD-10-CM | POA: Diagnosis not present

## 2022-09-10 DIAGNOSIS — U071 COVID-19: Secondary | ICD-10-CM | POA: Diagnosis not present

## 2022-09-26 DIAGNOSIS — F411 Generalized anxiety disorder: Secondary | ICD-10-CM | POA: Diagnosis not present

## 2022-10-10 DIAGNOSIS — F411 Generalized anxiety disorder: Secondary | ICD-10-CM | POA: Diagnosis not present

## 2022-10-24 DIAGNOSIS — F411 Generalized anxiety disorder: Secondary | ICD-10-CM | POA: Diagnosis not present

## 2022-11-05 DIAGNOSIS — N342 Other urethritis: Secondary | ICD-10-CM | POA: Diagnosis not present

## 2022-11-08 DIAGNOSIS — N342 Other urethritis: Secondary | ICD-10-CM | POA: Diagnosis not present

## 2022-11-08 DIAGNOSIS — R300 Dysuria: Secondary | ICD-10-CM | POA: Diagnosis not present

## 2022-11-08 DIAGNOSIS — Z6827 Body mass index (BMI) 27.0-27.9, adult: Secondary | ICD-10-CM | POA: Diagnosis not present

## 2022-11-12 DIAGNOSIS — R310 Gross hematuria: Secondary | ICD-10-CM | POA: Diagnosis not present

## 2022-11-12 DIAGNOSIS — R102 Pelvic and perineal pain: Secondary | ICD-10-CM | POA: Diagnosis not present

## 2022-11-12 DIAGNOSIS — N341 Nonspecific urethritis: Secondary | ICD-10-CM | POA: Diagnosis not present

## 2023-02-06 DIAGNOSIS — K625 Hemorrhage of anus and rectum: Secondary | ICD-10-CM | POA: Diagnosis not present

## 2023-02-06 DIAGNOSIS — F1090 Alcohol use, unspecified, uncomplicated: Secondary | ICD-10-CM | POA: Diagnosis not present

## 2023-02-06 DIAGNOSIS — R1084 Generalized abdominal pain: Secondary | ICD-10-CM | POA: Diagnosis not present

## 2023-02-12 DIAGNOSIS — K625 Hemorrhage of anus and rectum: Secondary | ICD-10-CM | POA: Diagnosis not present

## 2023-02-21 DIAGNOSIS — D125 Benign neoplasm of sigmoid colon: Secondary | ICD-10-CM | POA: Diagnosis not present

## 2023-02-21 DIAGNOSIS — K621 Rectal polyp: Secondary | ICD-10-CM | POA: Diagnosis not present

## 2023-02-21 DIAGNOSIS — Z1211 Encounter for screening for malignant neoplasm of colon: Secondary | ICD-10-CM | POA: Diagnosis not present

## 2023-02-21 DIAGNOSIS — D123 Benign neoplasm of transverse colon: Secondary | ICD-10-CM | POA: Diagnosis not present

## 2023-02-21 DIAGNOSIS — K5730 Diverticulosis of large intestine without perforation or abscess without bleeding: Secondary | ICD-10-CM | POA: Diagnosis not present

## 2023-03-05 ENCOUNTER — Other Ambulatory Visit: Payer: Self-pay | Admitting: Nurse Practitioner

## 2023-03-05 ENCOUNTER — Other Ambulatory Visit (HOSPITAL_COMMUNITY): Payer: Self-pay | Admitting: Nurse Practitioner

## 2023-03-05 DIAGNOSIS — R109 Unspecified abdominal pain: Secondary | ICD-10-CM | POA: Diagnosis not present

## 2023-03-05 DIAGNOSIS — R110 Nausea: Secondary | ICD-10-CM | POA: Diagnosis not present

## 2023-03-07 ENCOUNTER — Ambulatory Visit
Admission: RE | Admit: 2023-03-07 | Discharge: 2023-03-07 | Disposition: A | Discharge status: Home / Self Care | Payer: BC Managed Care – PPO | Source: Ambulatory Visit | Attending: Nurse Practitioner | Admitting: Nurse Practitioner

## 2023-03-07 DIAGNOSIS — R109 Unspecified abdominal pain: Secondary | ICD-10-CM

## 2023-03-11 ENCOUNTER — Encounter (HOSPITAL_COMMUNITY)
Admission: RE | Admit: 2023-03-11 | Discharge: 2023-03-11 | Disposition: A | Discharge status: Home / Self Care | Payer: BC Managed Care – PPO | Source: Ambulatory Visit | Attending: Nurse Practitioner | Admitting: Nurse Practitioner

## 2023-03-11 DIAGNOSIS — R110 Nausea: Secondary | ICD-10-CM | POA: Diagnosis not present

## 2023-03-11 DIAGNOSIS — Z0389 Encounter for observation for other suspected diseases and conditions ruled out: Secondary | ICD-10-CM | POA: Diagnosis not present

## 2023-03-11 MED ORDER — TECHNETIUM TC 99M SULFUR COLLOID
2.0500 | Freq: Once | INTRAVENOUS | Status: AC | PRN
  Administered 2023-03-11: 2.05 via INTRAVENOUS

## 2023-03-13 ENCOUNTER — Other Ambulatory Visit: Payer: Self-pay | Admitting: Gastroenterology

## 2023-03-13 DIAGNOSIS — R110 Nausea: Secondary | ICD-10-CM

## 2023-03-13 DIAGNOSIS — R109 Unspecified abdominal pain: Secondary | ICD-10-CM

## 2023-03-19 ENCOUNTER — Ambulatory Visit
Admission: RE | Admit: 2023-03-19 | Discharge: 2023-03-19 | Disposition: A | Discharge status: Home / Self Care | Source: Ambulatory Visit | Attending: Gastroenterology | Admitting: Gastroenterology

## 2023-03-19 DIAGNOSIS — R110 Nausea: Secondary | ICD-10-CM

## 2023-03-19 DIAGNOSIS — R109 Unspecified abdominal pain: Secondary | ICD-10-CM | POA: Diagnosis not present

## 2023-03-19 MED ORDER — IOPAMIDOL (ISOVUE-300) INJECTION 61%
100.0000 mL | Freq: Once | INTRAVENOUS | Status: AC | PRN
  Administered 2023-03-19: 100 mL via INTRAVENOUS

## 2023-03-27 DIAGNOSIS — K2289 Other specified disease of esophagus: Secondary | ICD-10-CM | POA: Diagnosis not present

## 2023-03-27 DIAGNOSIS — K449 Diaphragmatic hernia without obstruction or gangrene: Secondary | ICD-10-CM | POA: Diagnosis not present

## 2023-03-27 DIAGNOSIS — K2930 Chronic superficial gastritis without bleeding: Secondary | ICD-10-CM | POA: Diagnosis not present

## 2023-03-27 DIAGNOSIS — R110 Nausea: Secondary | ICD-10-CM | POA: Diagnosis not present

## 2023-03-27 DIAGNOSIS — K2960 Other gastritis without bleeding: Secondary | ICD-10-CM | POA: Diagnosis not present

## 2023-03-27 DIAGNOSIS — K2281 Esophageal polyp: Secondary | ICD-10-CM | POA: Diagnosis not present

## 2023-03-27 DIAGNOSIS — D130 Benign neoplasm of esophagus: Secondary | ICD-10-CM | POA: Diagnosis not present

## 2023-03-27 DIAGNOSIS — K3189 Other diseases of stomach and duodenum: Secondary | ICD-10-CM | POA: Diagnosis not present

## 2023-04-02 DIAGNOSIS — G4701 Insomnia due to medical condition: Secondary | ICD-10-CM | POA: Diagnosis not present

## 2023-04-02 DIAGNOSIS — F419 Anxiety disorder, unspecified: Secondary | ICD-10-CM | POA: Diagnosis not present

## 2023-04-02 DIAGNOSIS — E7800 Pure hypercholesterolemia, unspecified: Secondary | ICD-10-CM | POA: Diagnosis not present

## 2023-04-02 DIAGNOSIS — Z0000 Encounter for general adult medical examination without abnormal findings: Secondary | ICD-10-CM | POA: Diagnosis not present

## 2023-04-02 DIAGNOSIS — E559 Vitamin D deficiency, unspecified: Secondary | ICD-10-CM | POA: Diagnosis not present

## 2023-04-02 DIAGNOSIS — Z125 Encounter for screening for malignant neoplasm of prostate: Secondary | ICD-10-CM | POA: Diagnosis not present

## 2023-06-02 DIAGNOSIS — L255 Unspecified contact dermatitis due to plants, except food: Secondary | ICD-10-CM | POA: Diagnosis not present

## 2023-06-02 DIAGNOSIS — R21 Rash and other nonspecific skin eruption: Secondary | ICD-10-CM | POA: Diagnosis not present

## 2023-06-06 DIAGNOSIS — L237 Allergic contact dermatitis due to plants, except food: Secondary | ICD-10-CM | POA: Diagnosis not present

## 2023-07-16 DIAGNOSIS — Z3141 Encounter for fertility testing: Secondary | ICD-10-CM | POA: Diagnosis not present

## 2023-08-04 DIAGNOSIS — Z31440 Encounter of male for testing for genetic disease carrier status for procreative management: Secondary | ICD-10-CM | POA: Diagnosis not present

## 2023-08-05 DIAGNOSIS — Z113 Encounter for screening for infections with a predominantly sexual mode of transmission: Secondary | ICD-10-CM | POA: Diagnosis not present
